# Patient Record
Sex: Female | Born: 1945 | Race: White | Hispanic: No | Marital: Single | Smoking: Never smoker
Health system: Southern US, Community
[De-identification: ages and names within clinical notes are randomized; demographics above are authoritative.]

## PROBLEM LIST (undated history)

## (undated) DIAGNOSIS — M419 Scoliosis, unspecified: Secondary | ICD-10-CM

## (undated) DIAGNOSIS — N189 Chronic kidney disease, unspecified: Secondary | ICD-10-CM

## (undated) DIAGNOSIS — Z9889 Other specified postprocedural states: Secondary | ICD-10-CM

## (undated) DIAGNOSIS — R251 Tremor, unspecified: Secondary | ICD-10-CM

## (undated) DIAGNOSIS — L719 Rosacea, unspecified: Secondary | ICD-10-CM

## (undated) DIAGNOSIS — G709 Myoneural disorder, unspecified: Secondary | ICD-10-CM

## (undated) DIAGNOSIS — M810 Age-related osteoporosis without current pathological fracture: Secondary | ICD-10-CM

## (undated) DIAGNOSIS — N1330 Unspecified hydronephrosis: Secondary | ICD-10-CM

## (undated) DIAGNOSIS — N1831 Chronic kidney disease, stage 3a: Secondary | ICD-10-CM

## (undated) DIAGNOSIS — R112 Nausea with vomiting, unspecified: Secondary | ICD-10-CM

## (undated) DIAGNOSIS — G243 Spasmodic torticollis: Secondary | ICD-10-CM

## (undated) HISTORY — DX: Spasmodic torticollis: G24.3

## (undated) HISTORY — DX: Age-related osteoporosis without current pathological fracture: M81.0

## (undated) HISTORY — PX: INGUINAL HERNIA REPAIR: SUR1180

## (undated) HISTORY — DX: Chronic kidney disease, stage 3a: N18.31

## (undated) HISTORY — DX: Unspecified hydronephrosis: N13.30

## (undated) HISTORY — DX: Scoliosis, unspecified: M41.9

---

## 1981-12-15 HISTORY — PX: BREAST LUMPECTOMY: SHX2

## 1999-12-16 HISTORY — PX: THYROID CYST EXCISION: SHX2511

## 2011-12-24 DIAGNOSIS — M531 Cervicobrachial syndrome: Secondary | ICD-10-CM | POA: Diagnosis not present

## 2011-12-24 DIAGNOSIS — M999 Biomechanical lesion, unspecified: Secondary | ICD-10-CM | POA: Diagnosis not present

## 2011-12-24 DIAGNOSIS — M5030 Other cervical disc degeneration, unspecified cervical region: Secondary | ICD-10-CM | POA: Diagnosis not present

## 2011-12-24 DIAGNOSIS — M9981 Other biomechanical lesions of cervical region: Secondary | ICD-10-CM | POA: Diagnosis not present

## 2012-01-22 DIAGNOSIS — M531 Cervicobrachial syndrome: Secondary | ICD-10-CM | POA: Diagnosis not present

## 2012-01-22 DIAGNOSIS — M9981 Other biomechanical lesions of cervical region: Secondary | ICD-10-CM | POA: Diagnosis not present

## 2012-01-22 DIAGNOSIS — M999 Biomechanical lesion, unspecified: Secondary | ICD-10-CM | POA: Diagnosis not present

## 2012-01-22 DIAGNOSIS — M5030 Other cervical disc degeneration, unspecified cervical region: Secondary | ICD-10-CM | POA: Diagnosis not present

## 2012-02-26 DIAGNOSIS — M999 Biomechanical lesion, unspecified: Secondary | ICD-10-CM | POA: Diagnosis not present

## 2012-02-26 DIAGNOSIS — M531 Cervicobrachial syndrome: Secondary | ICD-10-CM | POA: Diagnosis not present

## 2012-02-26 DIAGNOSIS — M9981 Other biomechanical lesions of cervical region: Secondary | ICD-10-CM | POA: Diagnosis not present

## 2012-02-26 DIAGNOSIS — M5030 Other cervical disc degeneration, unspecified cervical region: Secondary | ICD-10-CM | POA: Diagnosis not present

## 2012-04-08 DIAGNOSIS — M999 Biomechanical lesion, unspecified: Secondary | ICD-10-CM | POA: Diagnosis not present

## 2012-04-08 DIAGNOSIS — M531 Cervicobrachial syndrome: Secondary | ICD-10-CM | POA: Diagnosis not present

## 2012-04-08 DIAGNOSIS — M9981 Other biomechanical lesions of cervical region: Secondary | ICD-10-CM | POA: Diagnosis not present

## 2012-04-08 DIAGNOSIS — M5030 Other cervical disc degeneration, unspecified cervical region: Secondary | ICD-10-CM | POA: Diagnosis not present

## 2012-05-06 DIAGNOSIS — M999 Biomechanical lesion, unspecified: Secondary | ICD-10-CM | POA: Diagnosis not present

## 2012-05-06 DIAGNOSIS — M531 Cervicobrachial syndrome: Secondary | ICD-10-CM | POA: Diagnosis not present

## 2012-05-06 DIAGNOSIS — M9981 Other biomechanical lesions of cervical region: Secondary | ICD-10-CM | POA: Diagnosis not present

## 2012-05-06 DIAGNOSIS — M5030 Other cervical disc degeneration, unspecified cervical region: Secondary | ICD-10-CM | POA: Diagnosis not present

## 2012-06-03 DIAGNOSIS — M999 Biomechanical lesion, unspecified: Secondary | ICD-10-CM | POA: Diagnosis not present

## 2012-06-03 DIAGNOSIS — M9981 Other biomechanical lesions of cervical region: Secondary | ICD-10-CM | POA: Diagnosis not present

## 2012-06-03 DIAGNOSIS — M531 Cervicobrachial syndrome: Secondary | ICD-10-CM | POA: Diagnosis not present

## 2012-06-03 DIAGNOSIS — M5030 Other cervical disc degeneration, unspecified cervical region: Secondary | ICD-10-CM | POA: Diagnosis not present

## 2012-07-01 DIAGNOSIS — M999 Biomechanical lesion, unspecified: Secondary | ICD-10-CM | POA: Diagnosis not present

## 2012-07-01 DIAGNOSIS — M531 Cervicobrachial syndrome: Secondary | ICD-10-CM | POA: Diagnosis not present

## 2012-07-01 DIAGNOSIS — M9981 Other biomechanical lesions of cervical region: Secondary | ICD-10-CM | POA: Diagnosis not present

## 2012-07-01 DIAGNOSIS — M5030 Other cervical disc degeneration, unspecified cervical region: Secondary | ICD-10-CM | POA: Diagnosis not present

## 2012-08-05 DIAGNOSIS — M531 Cervicobrachial syndrome: Secondary | ICD-10-CM | POA: Diagnosis not present

## 2012-08-05 DIAGNOSIS — M5030 Other cervical disc degeneration, unspecified cervical region: Secondary | ICD-10-CM | POA: Diagnosis not present

## 2012-08-05 DIAGNOSIS — M999 Biomechanical lesion, unspecified: Secondary | ICD-10-CM | POA: Diagnosis not present

## 2012-08-05 DIAGNOSIS — M9981 Other biomechanical lesions of cervical region: Secondary | ICD-10-CM | POA: Diagnosis not present

## 2012-09-09 DIAGNOSIS — M9981 Other biomechanical lesions of cervical region: Secondary | ICD-10-CM | POA: Diagnosis not present

## 2012-09-09 DIAGNOSIS — M5126 Other intervertebral disc displacement, lumbar region: Secondary | ICD-10-CM | POA: Diagnosis not present

## 2012-09-09 DIAGNOSIS — M999 Biomechanical lesion, unspecified: Secondary | ICD-10-CM | POA: Diagnosis not present

## 2012-10-07 DIAGNOSIS — M999 Biomechanical lesion, unspecified: Secondary | ICD-10-CM | POA: Diagnosis not present

## 2012-10-07 DIAGNOSIS — M9981 Other biomechanical lesions of cervical region: Secondary | ICD-10-CM | POA: Diagnosis not present

## 2012-10-07 DIAGNOSIS — M5126 Other intervertebral disc displacement, lumbar region: Secondary | ICD-10-CM | POA: Diagnosis not present

## 2012-10-26 DIAGNOSIS — M5126 Other intervertebral disc displacement, lumbar region: Secondary | ICD-10-CM | POA: Diagnosis not present

## 2012-10-26 DIAGNOSIS — M9981 Other biomechanical lesions of cervical region: Secondary | ICD-10-CM | POA: Diagnosis not present

## 2012-10-26 DIAGNOSIS — M999 Biomechanical lesion, unspecified: Secondary | ICD-10-CM | POA: Diagnosis not present

## 2012-11-08 DIAGNOSIS — Q828 Other specified congenital malformations of skin: Secondary | ICD-10-CM | POA: Diagnosis not present

## 2012-12-06 DIAGNOSIS — M5126 Other intervertebral disc displacement, lumbar region: Secondary | ICD-10-CM | POA: Diagnosis not present

## 2012-12-06 DIAGNOSIS — M9981 Other biomechanical lesions of cervical region: Secondary | ICD-10-CM | POA: Diagnosis not present

## 2012-12-06 DIAGNOSIS — M999 Biomechanical lesion, unspecified: Secondary | ICD-10-CM | POA: Diagnosis not present

## 2012-12-28 DIAGNOSIS — M999 Biomechanical lesion, unspecified: Secondary | ICD-10-CM | POA: Diagnosis not present

## 2012-12-28 DIAGNOSIS — M9981 Other biomechanical lesions of cervical region: Secondary | ICD-10-CM | POA: Diagnosis not present

## 2012-12-28 DIAGNOSIS — M5126 Other intervertebral disc displacement, lumbar region: Secondary | ICD-10-CM | POA: Diagnosis not present

## 2013-02-11 DIAGNOSIS — M999 Biomechanical lesion, unspecified: Secondary | ICD-10-CM | POA: Diagnosis not present

## 2013-03-09 DIAGNOSIS — M9981 Other biomechanical lesions of cervical region: Secondary | ICD-10-CM | POA: Diagnosis not present

## 2013-03-09 DIAGNOSIS — M999 Biomechanical lesion, unspecified: Secondary | ICD-10-CM | POA: Diagnosis not present

## 2013-03-17 DIAGNOSIS — D1739 Benign lipomatous neoplasm of skin and subcutaneous tissue of other sites: Secondary | ICD-10-CM | POA: Diagnosis not present

## 2013-03-17 DIAGNOSIS — L820 Inflamed seborrheic keratosis: Secondary | ICD-10-CM | POA: Diagnosis not present

## 2013-03-17 DIAGNOSIS — L578 Other skin changes due to chronic exposure to nonionizing radiation: Secondary | ICD-10-CM | POA: Diagnosis not present

## 2013-04-11 DIAGNOSIS — M999 Biomechanical lesion, unspecified: Secondary | ICD-10-CM | POA: Diagnosis not present

## 2013-04-11 DIAGNOSIS — M9981 Other biomechanical lesions of cervical region: Secondary | ICD-10-CM | POA: Diagnosis not present

## 2013-05-03 DIAGNOSIS — D485 Neoplasm of uncertain behavior of skin: Secondary | ICD-10-CM | POA: Diagnosis not present

## 2013-05-30 DIAGNOSIS — H9319 Tinnitus, unspecified ear: Secondary | ICD-10-CM | POA: Diagnosis not present

## 2013-05-30 DIAGNOSIS — M9981 Other biomechanical lesions of cervical region: Secondary | ICD-10-CM | POA: Diagnosis not present

## 2013-05-30 DIAGNOSIS — M5030 Other cervical disc degeneration, unspecified cervical region: Secondary | ICD-10-CM | POA: Diagnosis not present

## 2013-05-30 DIAGNOSIS — H6120 Impacted cerumen, unspecified ear: Secondary | ICD-10-CM | POA: Diagnosis not present

## 2013-05-30 DIAGNOSIS — M531 Cervicobrachial syndrome: Secondary | ICD-10-CM | POA: Diagnosis not present

## 2013-05-30 DIAGNOSIS — M999 Biomechanical lesion, unspecified: Secondary | ICD-10-CM | POA: Diagnosis not present

## 2013-07-20 DIAGNOSIS — M5030 Other cervical disc degeneration, unspecified cervical region: Secondary | ICD-10-CM | POA: Diagnosis not present

## 2013-07-20 DIAGNOSIS — M999 Biomechanical lesion, unspecified: Secondary | ICD-10-CM | POA: Diagnosis not present

## 2013-07-20 DIAGNOSIS — M531 Cervicobrachial syndrome: Secondary | ICD-10-CM | POA: Diagnosis not present

## 2013-07-20 DIAGNOSIS — M9981 Other biomechanical lesions of cervical region: Secondary | ICD-10-CM | POA: Diagnosis not present

## 2013-10-04 DIAGNOSIS — M999 Biomechanical lesion, unspecified: Secondary | ICD-10-CM | POA: Diagnosis not present

## 2013-12-05 DIAGNOSIS — M999 Biomechanical lesion, unspecified: Secondary | ICD-10-CM | POA: Diagnosis not present

## 2013-12-23 DIAGNOSIS — M542 Cervicalgia: Secondary | ICD-10-CM | POA: Diagnosis not present

## 2014-01-16 DIAGNOSIS — M999 Biomechanical lesion, unspecified: Secondary | ICD-10-CM | POA: Diagnosis not present

## 2014-02-15 DIAGNOSIS — M999 Biomechanical lesion, unspecified: Secondary | ICD-10-CM | POA: Diagnosis not present

## 2014-03-08 DIAGNOSIS — M546 Pain in thoracic spine: Secondary | ICD-10-CM | POA: Diagnosis not present

## 2014-03-08 DIAGNOSIS — M545 Low back pain, unspecified: Secondary | ICD-10-CM | POA: Diagnosis not present

## 2014-03-08 DIAGNOSIS — M542 Cervicalgia: Secondary | ICD-10-CM | POA: Diagnosis not present

## 2014-03-08 DIAGNOSIS — M5412 Radiculopathy, cervical region: Secondary | ICD-10-CM | POA: Diagnosis not present

## 2014-03-09 DIAGNOSIS — M545 Low back pain, unspecified: Secondary | ICD-10-CM | POA: Diagnosis not present

## 2014-03-09 DIAGNOSIS — M5412 Radiculopathy, cervical region: Secondary | ICD-10-CM | POA: Diagnosis not present

## 2014-03-09 DIAGNOSIS — M542 Cervicalgia: Secondary | ICD-10-CM | POA: Diagnosis not present

## 2014-03-09 DIAGNOSIS — M5137 Other intervertebral disc degeneration, lumbosacral region: Secondary | ICD-10-CM | POA: Diagnosis not present

## 2014-03-09 DIAGNOSIS — M546 Pain in thoracic spine: Secondary | ICD-10-CM | POA: Diagnosis not present

## 2014-03-09 DIAGNOSIS — M9981 Other biomechanical lesions of cervical region: Secondary | ICD-10-CM | POA: Diagnosis not present

## 2014-03-09 DIAGNOSIS — M999 Biomechanical lesion, unspecified: Secondary | ICD-10-CM | POA: Diagnosis not present

## 2014-04-03 DIAGNOSIS — M545 Low back pain, unspecified: Secondary | ICD-10-CM | POA: Diagnosis not present

## 2014-04-03 DIAGNOSIS — M9981 Other biomechanical lesions of cervical region: Secondary | ICD-10-CM | POA: Diagnosis not present

## 2014-04-03 DIAGNOSIS — M5137 Other intervertebral disc degeneration, lumbosacral region: Secondary | ICD-10-CM | POA: Diagnosis not present

## 2014-04-03 DIAGNOSIS — M5412 Radiculopathy, cervical region: Secondary | ICD-10-CM | POA: Diagnosis not present

## 2014-04-03 DIAGNOSIS — M999 Biomechanical lesion, unspecified: Secondary | ICD-10-CM | POA: Diagnosis not present

## 2014-04-03 DIAGNOSIS — M546 Pain in thoracic spine: Secondary | ICD-10-CM | POA: Diagnosis not present

## 2014-04-03 DIAGNOSIS — M542 Cervicalgia: Secondary | ICD-10-CM | POA: Diagnosis not present

## 2014-05-05 DIAGNOSIS — M999 Biomechanical lesion, unspecified: Secondary | ICD-10-CM | POA: Diagnosis not present

## 2014-05-31 DIAGNOSIS — M999 Biomechanical lesion, unspecified: Secondary | ICD-10-CM | POA: Diagnosis not present

## 2014-06-22 DIAGNOSIS — M999 Biomechanical lesion, unspecified: Secondary | ICD-10-CM | POA: Diagnosis not present

## 2014-06-22 DIAGNOSIS — M5137 Other intervertebral disc degeneration, lumbosacral region: Secondary | ICD-10-CM | POA: Diagnosis not present

## 2014-06-22 DIAGNOSIS — M9981 Other biomechanical lesions of cervical region: Secondary | ICD-10-CM | POA: Diagnosis not present

## 2014-08-02 DIAGNOSIS — M999 Biomechanical lesion, unspecified: Secondary | ICD-10-CM | POA: Diagnosis not present

## 2014-08-30 DIAGNOSIS — M999 Biomechanical lesion, unspecified: Secondary | ICD-10-CM | POA: Diagnosis not present

## 2014-10-03 DIAGNOSIS — M9902 Segmental and somatic dysfunction of thoracic region: Secondary | ICD-10-CM | POA: Diagnosis not present

## 2014-10-03 DIAGNOSIS — M5441 Lumbago with sciatica, right side: Secondary | ICD-10-CM | POA: Diagnosis not present

## 2014-10-03 DIAGNOSIS — M546 Pain in thoracic spine: Secondary | ICD-10-CM | POA: Diagnosis not present

## 2014-10-03 DIAGNOSIS — M5136 Other intervertebral disc degeneration, lumbar region: Secondary | ICD-10-CM | POA: Diagnosis not present

## 2014-10-03 DIAGNOSIS — M9903 Segmental and somatic dysfunction of lumbar region: Secondary | ICD-10-CM | POA: Diagnosis not present

## 2014-10-03 DIAGNOSIS — M5137 Other intervertebral disc degeneration, lumbosacral region: Secondary | ICD-10-CM | POA: Diagnosis not present

## 2014-10-03 DIAGNOSIS — M9904 Segmental and somatic dysfunction of sacral region: Secondary | ICD-10-CM | POA: Diagnosis not present

## 2014-11-28 DIAGNOSIS — M5441 Lumbago with sciatica, right side: Secondary | ICD-10-CM | POA: Diagnosis not present

## 2014-11-28 DIAGNOSIS — M546 Pain in thoracic spine: Secondary | ICD-10-CM | POA: Diagnosis not present

## 2014-11-28 DIAGNOSIS — M9903 Segmental and somatic dysfunction of lumbar region: Secondary | ICD-10-CM | POA: Diagnosis not present

## 2014-11-28 DIAGNOSIS — M9904 Segmental and somatic dysfunction of sacral region: Secondary | ICD-10-CM | POA: Diagnosis not present

## 2014-11-28 DIAGNOSIS — M5137 Other intervertebral disc degeneration, lumbosacral region: Secondary | ICD-10-CM | POA: Diagnosis not present

## 2014-11-28 DIAGNOSIS — M9902 Segmental and somatic dysfunction of thoracic region: Secondary | ICD-10-CM | POA: Diagnosis not present

## 2014-11-28 DIAGNOSIS — M5136 Other intervertebral disc degeneration, lumbar region: Secondary | ICD-10-CM | POA: Diagnosis not present

## 2015-01-08 DIAGNOSIS — M5441 Lumbago with sciatica, right side: Secondary | ICD-10-CM | POA: Diagnosis not present

## 2015-01-08 DIAGNOSIS — M9902 Segmental and somatic dysfunction of thoracic region: Secondary | ICD-10-CM | POA: Diagnosis not present

## 2015-01-08 DIAGNOSIS — M9903 Segmental and somatic dysfunction of lumbar region: Secondary | ICD-10-CM | POA: Diagnosis not present

## 2015-01-08 DIAGNOSIS — M546 Pain in thoracic spine: Secondary | ICD-10-CM | POA: Diagnosis not present

## 2015-01-08 DIAGNOSIS — M9904 Segmental and somatic dysfunction of sacral region: Secondary | ICD-10-CM | POA: Diagnosis not present

## 2015-01-08 DIAGNOSIS — M5137 Other intervertebral disc degeneration, lumbosacral region: Secondary | ICD-10-CM | POA: Diagnosis not present

## 2015-01-08 DIAGNOSIS — M5136 Other intervertebral disc degeneration, lumbar region: Secondary | ICD-10-CM | POA: Diagnosis not present

## 2015-02-08 DIAGNOSIS — M9903 Segmental and somatic dysfunction of lumbar region: Secondary | ICD-10-CM | POA: Diagnosis not present

## 2015-02-08 DIAGNOSIS — M5441 Lumbago with sciatica, right side: Secondary | ICD-10-CM | POA: Diagnosis not present

## 2015-02-08 DIAGNOSIS — M5137 Other intervertebral disc degeneration, lumbosacral region: Secondary | ICD-10-CM | POA: Diagnosis not present

## 2015-02-08 DIAGNOSIS — M546 Pain in thoracic spine: Secondary | ICD-10-CM | POA: Diagnosis not present

## 2015-02-08 DIAGNOSIS — M5136 Other intervertebral disc degeneration, lumbar region: Secondary | ICD-10-CM | POA: Diagnosis not present

## 2015-02-08 DIAGNOSIS — M9904 Segmental and somatic dysfunction of sacral region: Secondary | ICD-10-CM | POA: Diagnosis not present

## 2015-02-08 DIAGNOSIS — M9902 Segmental and somatic dysfunction of thoracic region: Secondary | ICD-10-CM | POA: Diagnosis not present

## 2015-03-07 DIAGNOSIS — M5137 Other intervertebral disc degeneration, lumbosacral region: Secondary | ICD-10-CM | POA: Diagnosis not present

## 2015-03-07 DIAGNOSIS — M5136 Other intervertebral disc degeneration, lumbar region: Secondary | ICD-10-CM | POA: Diagnosis not present

## 2015-03-07 DIAGNOSIS — M9903 Segmental and somatic dysfunction of lumbar region: Secondary | ICD-10-CM | POA: Diagnosis not present

## 2015-03-07 DIAGNOSIS — M9902 Segmental and somatic dysfunction of thoracic region: Secondary | ICD-10-CM | POA: Diagnosis not present

## 2015-03-07 DIAGNOSIS — M546 Pain in thoracic spine: Secondary | ICD-10-CM | POA: Diagnosis not present

## 2015-03-07 DIAGNOSIS — M9904 Segmental and somatic dysfunction of sacral region: Secondary | ICD-10-CM | POA: Diagnosis not present

## 2015-03-07 DIAGNOSIS — M5441 Lumbago with sciatica, right side: Secondary | ICD-10-CM | POA: Diagnosis not present

## 2015-04-11 DIAGNOSIS — M5137 Other intervertebral disc degeneration, lumbosacral region: Secondary | ICD-10-CM | POA: Diagnosis not present

## 2015-04-11 DIAGNOSIS — M546 Pain in thoracic spine: Secondary | ICD-10-CM | POA: Diagnosis not present

## 2015-04-11 DIAGNOSIS — M9903 Segmental and somatic dysfunction of lumbar region: Secondary | ICD-10-CM | POA: Diagnosis not present

## 2015-04-11 DIAGNOSIS — M5136 Other intervertebral disc degeneration, lumbar region: Secondary | ICD-10-CM | POA: Diagnosis not present

## 2015-04-11 DIAGNOSIS — M5441 Lumbago with sciatica, right side: Secondary | ICD-10-CM | POA: Diagnosis not present

## 2015-04-11 DIAGNOSIS — M9904 Segmental and somatic dysfunction of sacral region: Secondary | ICD-10-CM | POA: Diagnosis not present

## 2015-04-11 DIAGNOSIS — M9902 Segmental and somatic dysfunction of thoracic region: Secondary | ICD-10-CM | POA: Diagnosis not present

## 2015-05-10 DIAGNOSIS — M9904 Segmental and somatic dysfunction of sacral region: Secondary | ICD-10-CM | POA: Diagnosis not present

## 2015-05-10 DIAGNOSIS — M546 Pain in thoracic spine: Secondary | ICD-10-CM | POA: Diagnosis not present

## 2015-05-10 DIAGNOSIS — M5136 Other intervertebral disc degeneration, lumbar region: Secondary | ICD-10-CM | POA: Diagnosis not present

## 2015-05-10 DIAGNOSIS — M5441 Lumbago with sciatica, right side: Secondary | ICD-10-CM | POA: Diagnosis not present

## 2015-05-10 DIAGNOSIS — M9903 Segmental and somatic dysfunction of lumbar region: Secondary | ICD-10-CM | POA: Diagnosis not present

## 2015-05-10 DIAGNOSIS — M5137 Other intervertebral disc degeneration, lumbosacral region: Secondary | ICD-10-CM | POA: Diagnosis not present

## 2015-05-10 DIAGNOSIS — M9902 Segmental and somatic dysfunction of thoracic region: Secondary | ICD-10-CM | POA: Diagnosis not present

## 2015-05-30 DIAGNOSIS — M9905 Segmental and somatic dysfunction of pelvic region: Secondary | ICD-10-CM | POA: Diagnosis not present

## 2015-05-30 DIAGNOSIS — M9904 Segmental and somatic dysfunction of sacral region: Secondary | ICD-10-CM | POA: Diagnosis not present

## 2015-05-30 DIAGNOSIS — M9903 Segmental and somatic dysfunction of lumbar region: Secondary | ICD-10-CM | POA: Diagnosis not present

## 2015-05-30 DIAGNOSIS — M9902 Segmental and somatic dysfunction of thoracic region: Secondary | ICD-10-CM | POA: Diagnosis not present

## 2015-08-01 DIAGNOSIS — M9903 Segmental and somatic dysfunction of lumbar region: Secondary | ICD-10-CM | POA: Diagnosis not present

## 2015-08-01 DIAGNOSIS — M9905 Segmental and somatic dysfunction of pelvic region: Secondary | ICD-10-CM | POA: Diagnosis not present

## 2015-08-01 DIAGNOSIS — M9902 Segmental and somatic dysfunction of thoracic region: Secondary | ICD-10-CM | POA: Diagnosis not present

## 2015-08-01 DIAGNOSIS — M9904 Segmental and somatic dysfunction of sacral region: Secondary | ICD-10-CM | POA: Diagnosis not present

## 2015-09-25 DIAGNOSIS — M9903 Segmental and somatic dysfunction of lumbar region: Secondary | ICD-10-CM | POA: Diagnosis not present

## 2015-09-25 DIAGNOSIS — M5137 Other intervertebral disc degeneration, lumbosacral region: Secondary | ICD-10-CM | POA: Diagnosis not present

## 2015-09-25 DIAGNOSIS — M5441 Lumbago with sciatica, right side: Secondary | ICD-10-CM | POA: Diagnosis not present

## 2015-09-25 DIAGNOSIS — M5136 Other intervertebral disc degeneration, lumbar region: Secondary | ICD-10-CM | POA: Diagnosis not present

## 2015-09-25 DIAGNOSIS — M9902 Segmental and somatic dysfunction of thoracic region: Secondary | ICD-10-CM | POA: Diagnosis not present

## 2015-09-25 DIAGNOSIS — M9904 Segmental and somatic dysfunction of sacral region: Secondary | ICD-10-CM | POA: Diagnosis not present

## 2015-09-25 DIAGNOSIS — M546 Pain in thoracic spine: Secondary | ICD-10-CM | POA: Diagnosis not present

## 2015-10-16 DIAGNOSIS — M9903 Segmental and somatic dysfunction of lumbar region: Secondary | ICD-10-CM | POA: Diagnosis not present

## 2015-10-16 DIAGNOSIS — M9902 Segmental and somatic dysfunction of thoracic region: Secondary | ICD-10-CM | POA: Diagnosis not present

## 2015-10-16 DIAGNOSIS — M9905 Segmental and somatic dysfunction of pelvic region: Secondary | ICD-10-CM | POA: Diagnosis not present

## 2015-10-16 DIAGNOSIS — M9904 Segmental and somatic dysfunction of sacral region: Secondary | ICD-10-CM | POA: Diagnosis not present

## 2015-12-05 DIAGNOSIS — M9902 Segmental and somatic dysfunction of thoracic region: Secondary | ICD-10-CM | POA: Diagnosis not present

## 2015-12-05 DIAGNOSIS — M9905 Segmental and somatic dysfunction of pelvic region: Secondary | ICD-10-CM | POA: Diagnosis not present

## 2015-12-05 DIAGNOSIS — M9903 Segmental and somatic dysfunction of lumbar region: Secondary | ICD-10-CM | POA: Diagnosis not present

## 2015-12-05 DIAGNOSIS — M9904 Segmental and somatic dysfunction of sacral region: Secondary | ICD-10-CM | POA: Diagnosis not present

## 2016-01-02 DIAGNOSIS — M5441 Lumbago with sciatica, right side: Secondary | ICD-10-CM | POA: Diagnosis not present

## 2016-01-02 DIAGNOSIS — M9904 Segmental and somatic dysfunction of sacral region: Secondary | ICD-10-CM | POA: Diagnosis not present

## 2016-01-02 DIAGNOSIS — M5137 Other intervertebral disc degeneration, lumbosacral region: Secondary | ICD-10-CM | POA: Diagnosis not present

## 2016-01-02 DIAGNOSIS — M9903 Segmental and somatic dysfunction of lumbar region: Secondary | ICD-10-CM | POA: Diagnosis not present

## 2016-01-02 DIAGNOSIS — M546 Pain in thoracic spine: Secondary | ICD-10-CM | POA: Diagnosis not present

## 2016-01-02 DIAGNOSIS — M5136 Other intervertebral disc degeneration, lumbar region: Secondary | ICD-10-CM | POA: Diagnosis not present

## 2016-01-02 DIAGNOSIS — M9902 Segmental and somatic dysfunction of thoracic region: Secondary | ICD-10-CM | POA: Diagnosis not present

## 2016-02-11 DIAGNOSIS — M546 Pain in thoracic spine: Secondary | ICD-10-CM | POA: Diagnosis not present

## 2016-02-11 DIAGNOSIS — M5441 Lumbago with sciatica, right side: Secondary | ICD-10-CM | POA: Diagnosis not present

## 2016-02-11 DIAGNOSIS — M9902 Segmental and somatic dysfunction of thoracic region: Secondary | ICD-10-CM | POA: Diagnosis not present

## 2016-02-11 DIAGNOSIS — M9904 Segmental and somatic dysfunction of sacral region: Secondary | ICD-10-CM | POA: Diagnosis not present

## 2016-02-11 DIAGNOSIS — M5137 Other intervertebral disc degeneration, lumbosacral region: Secondary | ICD-10-CM | POA: Diagnosis not present

## 2016-02-11 DIAGNOSIS — M9903 Segmental and somatic dysfunction of lumbar region: Secondary | ICD-10-CM | POA: Diagnosis not present

## 2016-02-11 DIAGNOSIS — M5136 Other intervertebral disc degeneration, lumbar region: Secondary | ICD-10-CM | POA: Diagnosis not present

## 2016-03-05 DIAGNOSIS — M5137 Other intervertebral disc degeneration, lumbosacral region: Secondary | ICD-10-CM | POA: Diagnosis not present

## 2016-03-05 DIAGNOSIS — M9902 Segmental and somatic dysfunction of thoracic region: Secondary | ICD-10-CM | POA: Diagnosis not present

## 2016-03-05 DIAGNOSIS — M546 Pain in thoracic spine: Secondary | ICD-10-CM | POA: Diagnosis not present

## 2016-03-05 DIAGNOSIS — M5136 Other intervertebral disc degeneration, lumbar region: Secondary | ICD-10-CM | POA: Diagnosis not present

## 2016-03-05 DIAGNOSIS — M5441 Lumbago with sciatica, right side: Secondary | ICD-10-CM | POA: Diagnosis not present

## 2016-03-05 DIAGNOSIS — M9904 Segmental and somatic dysfunction of sacral region: Secondary | ICD-10-CM | POA: Diagnosis not present

## 2016-03-05 DIAGNOSIS — M9903 Segmental and somatic dysfunction of lumbar region: Secondary | ICD-10-CM | POA: Diagnosis not present

## 2016-04-21 DIAGNOSIS — M419 Scoliosis, unspecified: Secondary | ICD-10-CM | POA: Diagnosis not present

## 2016-04-28 DIAGNOSIS — M9902 Segmental and somatic dysfunction of thoracic region: Secondary | ICD-10-CM | POA: Diagnosis not present

## 2016-04-28 DIAGNOSIS — M546 Pain in thoracic spine: Secondary | ICD-10-CM | POA: Diagnosis not present

## 2016-04-28 DIAGNOSIS — M5136 Other intervertebral disc degeneration, lumbar region: Secondary | ICD-10-CM | POA: Diagnosis not present

## 2016-04-28 DIAGNOSIS — M5137 Other intervertebral disc degeneration, lumbosacral region: Secondary | ICD-10-CM | POA: Diagnosis not present

## 2016-04-28 DIAGNOSIS — M9904 Segmental and somatic dysfunction of sacral region: Secondary | ICD-10-CM | POA: Diagnosis not present

## 2016-04-28 DIAGNOSIS — M5441 Lumbago with sciatica, right side: Secondary | ICD-10-CM | POA: Diagnosis not present

## 2016-04-28 DIAGNOSIS — M9903 Segmental and somatic dysfunction of lumbar region: Secondary | ICD-10-CM | POA: Diagnosis not present

## 2016-05-05 DIAGNOSIS — M810 Age-related osteoporosis without current pathological fracture: Secondary | ICD-10-CM | POA: Diagnosis not present

## 2016-05-30 DIAGNOSIS — M9902 Segmental and somatic dysfunction of thoracic region: Secondary | ICD-10-CM | POA: Diagnosis not present

## 2016-05-30 DIAGNOSIS — M9904 Segmental and somatic dysfunction of sacral region: Secondary | ICD-10-CM | POA: Diagnosis not present

## 2016-05-30 DIAGNOSIS — M9901 Segmental and somatic dysfunction of cervical region: Secondary | ICD-10-CM | POA: Diagnosis not present

## 2016-05-30 DIAGNOSIS — M9903 Segmental and somatic dysfunction of lumbar region: Secondary | ICD-10-CM | POA: Diagnosis not present

## 2016-06-09 DIAGNOSIS — M5137 Other intervertebral disc degeneration, lumbosacral region: Secondary | ICD-10-CM | POA: Diagnosis not present

## 2016-06-09 DIAGNOSIS — M9902 Segmental and somatic dysfunction of thoracic region: Secondary | ICD-10-CM | POA: Diagnosis not present

## 2016-06-09 DIAGNOSIS — M546 Pain in thoracic spine: Secondary | ICD-10-CM | POA: Diagnosis not present

## 2016-06-09 DIAGNOSIS — M9904 Segmental and somatic dysfunction of sacral region: Secondary | ICD-10-CM | POA: Diagnosis not present

## 2016-06-09 DIAGNOSIS — M9903 Segmental and somatic dysfunction of lumbar region: Secondary | ICD-10-CM | POA: Diagnosis not present

## 2016-06-09 DIAGNOSIS — M5441 Lumbago with sciatica, right side: Secondary | ICD-10-CM | POA: Diagnosis not present

## 2016-06-09 DIAGNOSIS — M5136 Other intervertebral disc degeneration, lumbar region: Secondary | ICD-10-CM | POA: Diagnosis not present

## 2016-07-14 DIAGNOSIS — M9904 Segmental and somatic dysfunction of sacral region: Secondary | ICD-10-CM | POA: Diagnosis not present

## 2016-07-14 DIAGNOSIS — M546 Pain in thoracic spine: Secondary | ICD-10-CM | POA: Diagnosis not present

## 2016-07-14 DIAGNOSIS — M5441 Lumbago with sciatica, right side: Secondary | ICD-10-CM | POA: Diagnosis not present

## 2016-07-14 DIAGNOSIS — M5137 Other intervertebral disc degeneration, lumbosacral region: Secondary | ICD-10-CM | POA: Diagnosis not present

## 2016-07-14 DIAGNOSIS — M9903 Segmental and somatic dysfunction of lumbar region: Secondary | ICD-10-CM | POA: Diagnosis not present

## 2016-07-14 DIAGNOSIS — M9902 Segmental and somatic dysfunction of thoracic region: Secondary | ICD-10-CM | POA: Diagnosis not present

## 2016-07-14 DIAGNOSIS — M5136 Other intervertebral disc degeneration, lumbar region: Secondary | ICD-10-CM | POA: Diagnosis not present

## 2016-07-30 DIAGNOSIS — Z0000 Encounter for general adult medical examination without abnormal findings: Secondary | ICD-10-CM | POA: Diagnosis not present

## 2016-07-30 DIAGNOSIS — E559 Vitamin D deficiency, unspecified: Secondary | ICD-10-CM | POA: Diagnosis not present

## 2016-07-30 DIAGNOSIS — R030 Elevated blood-pressure reading, without diagnosis of hypertension: Secondary | ICD-10-CM | POA: Diagnosis not present

## 2016-07-30 DIAGNOSIS — M810 Age-related osteoporosis without current pathological fracture: Secondary | ICD-10-CM | POA: Diagnosis not present

## 2016-07-30 DIAGNOSIS — M4850XS Collapsed vertebra, not elsewhere classified, site unspecified, sequela of fracture: Secondary | ICD-10-CM | POA: Diagnosis not present

## 2016-07-30 DIAGNOSIS — Z8639 Personal history of other endocrine, nutritional and metabolic disease: Secondary | ICD-10-CM | POA: Diagnosis not present

## 2016-07-30 DIAGNOSIS — Z1389 Encounter for screening for other disorder: Secondary | ICD-10-CM | POA: Diagnosis not present

## 2016-07-30 DIAGNOSIS — R251 Tremor, unspecified: Secondary | ICD-10-CM | POA: Diagnosis not present

## 2016-07-30 DIAGNOSIS — Z6826 Body mass index (BMI) 26.0-26.9, adult: Secondary | ICD-10-CM | POA: Diagnosis not present

## 2016-07-30 DIAGNOSIS — M419 Scoliosis, unspecified: Secondary | ICD-10-CM | POA: Diagnosis not present

## 2016-08-13 DIAGNOSIS — M5136 Other intervertebral disc degeneration, lumbar region: Secondary | ICD-10-CM | POA: Diagnosis not present

## 2016-08-13 DIAGNOSIS — M5137 Other intervertebral disc degeneration, lumbosacral region: Secondary | ICD-10-CM | POA: Diagnosis not present

## 2016-08-13 DIAGNOSIS — M9902 Segmental and somatic dysfunction of thoracic region: Secondary | ICD-10-CM | POA: Diagnosis not present

## 2016-08-13 DIAGNOSIS — M546 Pain in thoracic spine: Secondary | ICD-10-CM | POA: Diagnosis not present

## 2016-08-13 DIAGNOSIS — M5441 Lumbago with sciatica, right side: Secondary | ICD-10-CM | POA: Diagnosis not present

## 2016-08-13 DIAGNOSIS — M9903 Segmental and somatic dysfunction of lumbar region: Secondary | ICD-10-CM | POA: Diagnosis not present

## 2016-08-13 DIAGNOSIS — M9904 Segmental and somatic dysfunction of sacral region: Secondary | ICD-10-CM | POA: Diagnosis not present

## 2016-09-10 DIAGNOSIS — M9902 Segmental and somatic dysfunction of thoracic region: Secondary | ICD-10-CM | POA: Diagnosis not present

## 2016-09-10 DIAGNOSIS — M546 Pain in thoracic spine: Secondary | ICD-10-CM | POA: Diagnosis not present

## 2016-09-10 DIAGNOSIS — M5136 Other intervertebral disc degeneration, lumbar region: Secondary | ICD-10-CM | POA: Diagnosis not present

## 2016-09-10 DIAGNOSIS — M9904 Segmental and somatic dysfunction of sacral region: Secondary | ICD-10-CM | POA: Diagnosis not present

## 2016-09-10 DIAGNOSIS — M9903 Segmental and somatic dysfunction of lumbar region: Secondary | ICD-10-CM | POA: Diagnosis not present

## 2016-09-10 DIAGNOSIS — M5441 Lumbago with sciatica, right side: Secondary | ICD-10-CM | POA: Diagnosis not present

## 2016-09-10 DIAGNOSIS — M5137 Other intervertebral disc degeneration, lumbosacral region: Secondary | ICD-10-CM | POA: Diagnosis not present

## 2016-10-08 DIAGNOSIS — M5137 Other intervertebral disc degeneration, lumbosacral region: Secondary | ICD-10-CM | POA: Diagnosis not present

## 2016-10-08 DIAGNOSIS — M9902 Segmental and somatic dysfunction of thoracic region: Secondary | ICD-10-CM | POA: Diagnosis not present

## 2016-10-08 DIAGNOSIS — M5136 Other intervertebral disc degeneration, lumbar region: Secondary | ICD-10-CM | POA: Diagnosis not present

## 2016-10-08 DIAGNOSIS — M546 Pain in thoracic spine: Secondary | ICD-10-CM | POA: Diagnosis not present

## 2016-10-08 DIAGNOSIS — M5441 Lumbago with sciatica, right side: Secondary | ICD-10-CM | POA: Diagnosis not present

## 2016-10-08 DIAGNOSIS — M9904 Segmental and somatic dysfunction of sacral region: Secondary | ICD-10-CM | POA: Diagnosis not present

## 2016-10-08 DIAGNOSIS — M9903 Segmental and somatic dysfunction of lumbar region: Secondary | ICD-10-CM | POA: Diagnosis not present

## 2016-11-12 DIAGNOSIS — M5137 Other intervertebral disc degeneration, lumbosacral region: Secondary | ICD-10-CM | POA: Diagnosis not present

## 2016-11-12 DIAGNOSIS — M5441 Lumbago with sciatica, right side: Secondary | ICD-10-CM | POA: Diagnosis not present

## 2016-11-12 DIAGNOSIS — M9904 Segmental and somatic dysfunction of sacral region: Secondary | ICD-10-CM | POA: Diagnosis not present

## 2016-11-12 DIAGNOSIS — M546 Pain in thoracic spine: Secondary | ICD-10-CM | POA: Diagnosis not present

## 2016-11-12 DIAGNOSIS — M9902 Segmental and somatic dysfunction of thoracic region: Secondary | ICD-10-CM | POA: Diagnosis not present

## 2016-11-12 DIAGNOSIS — M9903 Segmental and somatic dysfunction of lumbar region: Secondary | ICD-10-CM | POA: Diagnosis not present

## 2016-11-12 DIAGNOSIS — M5136 Other intervertebral disc degeneration, lumbar region: Secondary | ICD-10-CM | POA: Diagnosis not present

## 2016-12-03 DIAGNOSIS — M5136 Other intervertebral disc degeneration, lumbar region: Secondary | ICD-10-CM | POA: Diagnosis not present

## 2016-12-03 DIAGNOSIS — M9902 Segmental and somatic dysfunction of thoracic region: Secondary | ICD-10-CM | POA: Diagnosis not present

## 2016-12-03 DIAGNOSIS — M9904 Segmental and somatic dysfunction of sacral region: Secondary | ICD-10-CM | POA: Diagnosis not present

## 2016-12-03 DIAGNOSIS — M5137 Other intervertebral disc degeneration, lumbosacral region: Secondary | ICD-10-CM | POA: Diagnosis not present

## 2016-12-03 DIAGNOSIS — M9903 Segmental and somatic dysfunction of lumbar region: Secondary | ICD-10-CM | POA: Diagnosis not present

## 2016-12-03 DIAGNOSIS — M546 Pain in thoracic spine: Secondary | ICD-10-CM | POA: Diagnosis not present

## 2016-12-03 DIAGNOSIS — M5441 Lumbago with sciatica, right side: Secondary | ICD-10-CM | POA: Diagnosis not present

## 2016-12-17 DIAGNOSIS — Z1231 Encounter for screening mammogram for malignant neoplasm of breast: Secondary | ICD-10-CM | POA: Diagnosis not present

## 2017-01-15 DIAGNOSIS — M5441 Lumbago with sciatica, right side: Secondary | ICD-10-CM | POA: Diagnosis not present

## 2017-01-15 DIAGNOSIS — M9902 Segmental and somatic dysfunction of thoracic region: Secondary | ICD-10-CM | POA: Diagnosis not present

## 2017-01-15 DIAGNOSIS — M5137 Other intervertebral disc degeneration, lumbosacral region: Secondary | ICD-10-CM | POA: Diagnosis not present

## 2017-01-15 DIAGNOSIS — M9903 Segmental and somatic dysfunction of lumbar region: Secondary | ICD-10-CM | POA: Diagnosis not present

## 2017-01-15 DIAGNOSIS — M9904 Segmental and somatic dysfunction of sacral region: Secondary | ICD-10-CM | POA: Diagnosis not present

## 2017-01-15 DIAGNOSIS — M5136 Other intervertebral disc degeneration, lumbar region: Secondary | ICD-10-CM | POA: Diagnosis not present

## 2017-01-15 DIAGNOSIS — M546 Pain in thoracic spine: Secondary | ICD-10-CM | POA: Diagnosis not present

## 2017-02-16 DIAGNOSIS — M5136 Other intervertebral disc degeneration, lumbar region: Secondary | ICD-10-CM | POA: Diagnosis not present

## 2017-02-16 DIAGNOSIS — M9903 Segmental and somatic dysfunction of lumbar region: Secondary | ICD-10-CM | POA: Diagnosis not present

## 2017-02-16 DIAGNOSIS — M5137 Other intervertebral disc degeneration, lumbosacral region: Secondary | ICD-10-CM | POA: Diagnosis not present

## 2017-02-16 DIAGNOSIS — M419 Scoliosis, unspecified: Secondary | ICD-10-CM | POA: Diagnosis not present

## 2017-02-16 DIAGNOSIS — M2141 Flat foot [pes planus] (acquired), right foot: Secondary | ICD-10-CM | POA: Diagnosis not present

## 2017-02-16 DIAGNOSIS — M2142 Flat foot [pes planus] (acquired), left foot: Secondary | ICD-10-CM | POA: Diagnosis not present

## 2017-02-16 DIAGNOSIS — M545 Low back pain: Secondary | ICD-10-CM | POA: Diagnosis not present

## 2017-02-16 DIAGNOSIS — M5441 Lumbago with sciatica, right side: Secondary | ICD-10-CM | POA: Diagnosis not present

## 2017-02-16 DIAGNOSIS — M9904 Segmental and somatic dysfunction of sacral region: Secondary | ICD-10-CM | POA: Diagnosis not present

## 2017-02-16 DIAGNOSIS — M9902 Segmental and somatic dysfunction of thoracic region: Secondary | ICD-10-CM | POA: Diagnosis not present

## 2017-02-16 DIAGNOSIS — M546 Pain in thoracic spine: Secondary | ICD-10-CM | POA: Diagnosis not present

## 2017-02-23 DIAGNOSIS — M2141 Flat foot [pes planus] (acquired), right foot: Secondary | ICD-10-CM | POA: Diagnosis not present

## 2017-02-23 DIAGNOSIS — M2142 Flat foot [pes planus] (acquired), left foot: Secondary | ICD-10-CM | POA: Diagnosis not present

## 2017-02-23 DIAGNOSIS — M545 Low back pain: Secondary | ICD-10-CM | POA: Diagnosis not present

## 2017-02-23 DIAGNOSIS — M419 Scoliosis, unspecified: Secondary | ICD-10-CM | POA: Diagnosis not present

## 2017-03-03 DIAGNOSIS — M2141 Flat foot [pes planus] (acquired), right foot: Secondary | ICD-10-CM | POA: Diagnosis not present

## 2017-03-03 DIAGNOSIS — M545 Low back pain: Secondary | ICD-10-CM | POA: Diagnosis not present

## 2017-03-03 DIAGNOSIS — M419 Scoliosis, unspecified: Secondary | ICD-10-CM | POA: Diagnosis not present

## 2017-03-03 DIAGNOSIS — M2142 Flat foot [pes planus] (acquired), left foot: Secondary | ICD-10-CM | POA: Diagnosis not present

## 2017-03-04 DIAGNOSIS — M9903 Segmental and somatic dysfunction of lumbar region: Secondary | ICD-10-CM | POA: Diagnosis not present

## 2017-03-04 DIAGNOSIS — M5441 Lumbago with sciatica, right side: Secondary | ICD-10-CM | POA: Diagnosis not present

## 2017-03-04 DIAGNOSIS — M546 Pain in thoracic spine: Secondary | ICD-10-CM | POA: Diagnosis not present

## 2017-03-04 DIAGNOSIS — M5137 Other intervertebral disc degeneration, lumbosacral region: Secondary | ICD-10-CM | POA: Diagnosis not present

## 2017-03-04 DIAGNOSIS — M9902 Segmental and somatic dysfunction of thoracic region: Secondary | ICD-10-CM | POA: Diagnosis not present

## 2017-03-04 DIAGNOSIS — M5136 Other intervertebral disc degeneration, lumbar region: Secondary | ICD-10-CM | POA: Diagnosis not present

## 2017-03-04 DIAGNOSIS — M9904 Segmental and somatic dysfunction of sacral region: Secondary | ICD-10-CM | POA: Diagnosis not present

## 2017-03-09 DIAGNOSIS — M419 Scoliosis, unspecified: Secondary | ICD-10-CM | POA: Diagnosis not present

## 2017-03-09 DIAGNOSIS — M545 Low back pain: Secondary | ICD-10-CM | POA: Diagnosis not present

## 2017-03-09 DIAGNOSIS — M2141 Flat foot [pes planus] (acquired), right foot: Secondary | ICD-10-CM | POA: Diagnosis not present

## 2017-03-09 DIAGNOSIS — M2142 Flat foot [pes planus] (acquired), left foot: Secondary | ICD-10-CM | POA: Diagnosis not present

## 2017-03-12 DIAGNOSIS — M5136 Other intervertebral disc degeneration, lumbar region: Secondary | ICD-10-CM | POA: Diagnosis not present

## 2017-03-12 DIAGNOSIS — M546 Pain in thoracic spine: Secondary | ICD-10-CM | POA: Diagnosis not present

## 2017-03-12 DIAGNOSIS — M9902 Segmental and somatic dysfunction of thoracic region: Secondary | ICD-10-CM | POA: Diagnosis not present

## 2017-03-12 DIAGNOSIS — M5137 Other intervertebral disc degeneration, lumbosacral region: Secondary | ICD-10-CM | POA: Diagnosis not present

## 2017-03-12 DIAGNOSIS — M9904 Segmental and somatic dysfunction of sacral region: Secondary | ICD-10-CM | POA: Diagnosis not present

## 2017-03-12 DIAGNOSIS — M9903 Segmental and somatic dysfunction of lumbar region: Secondary | ICD-10-CM | POA: Diagnosis not present

## 2017-03-12 DIAGNOSIS — M5441 Lumbago with sciatica, right side: Secondary | ICD-10-CM | POA: Diagnosis not present

## 2017-03-20 DIAGNOSIS — M545 Low back pain: Secondary | ICD-10-CM | POA: Diagnosis not present

## 2017-03-20 DIAGNOSIS — M2142 Flat foot [pes planus] (acquired), left foot: Secondary | ICD-10-CM | POA: Diagnosis not present

## 2017-03-20 DIAGNOSIS — M2141 Flat foot [pes planus] (acquired), right foot: Secondary | ICD-10-CM | POA: Diagnosis not present

## 2017-03-20 DIAGNOSIS — M419 Scoliosis, unspecified: Secondary | ICD-10-CM | POA: Diagnosis not present

## 2017-04-28 DIAGNOSIS — M9904 Segmental and somatic dysfunction of sacral region: Secondary | ICD-10-CM | POA: Diagnosis not present

## 2017-04-28 DIAGNOSIS — M5137 Other intervertebral disc degeneration, lumbosacral region: Secondary | ICD-10-CM | POA: Diagnosis not present

## 2017-04-28 DIAGNOSIS — M5136 Other intervertebral disc degeneration, lumbar region: Secondary | ICD-10-CM | POA: Diagnosis not present

## 2017-04-28 DIAGNOSIS — M546 Pain in thoracic spine: Secondary | ICD-10-CM | POA: Diagnosis not present

## 2017-04-28 DIAGNOSIS — M5441 Lumbago with sciatica, right side: Secondary | ICD-10-CM | POA: Diagnosis not present

## 2017-04-28 DIAGNOSIS — M9902 Segmental and somatic dysfunction of thoracic region: Secondary | ICD-10-CM | POA: Diagnosis not present

## 2017-04-28 DIAGNOSIS — M9903 Segmental and somatic dysfunction of lumbar region: Secondary | ICD-10-CM | POA: Diagnosis not present

## 2017-06-10 DIAGNOSIS — M9904 Segmental and somatic dysfunction of sacral region: Secondary | ICD-10-CM | POA: Diagnosis not present

## 2017-06-10 DIAGNOSIS — M9902 Segmental and somatic dysfunction of thoracic region: Secondary | ICD-10-CM | POA: Diagnosis not present

## 2017-06-10 DIAGNOSIS — M9903 Segmental and somatic dysfunction of lumbar region: Secondary | ICD-10-CM | POA: Diagnosis not present

## 2017-06-10 DIAGNOSIS — M5137 Other intervertebral disc degeneration, lumbosacral region: Secondary | ICD-10-CM | POA: Diagnosis not present

## 2017-06-10 DIAGNOSIS — M546 Pain in thoracic spine: Secondary | ICD-10-CM | POA: Diagnosis not present

## 2017-06-10 DIAGNOSIS — M5441 Lumbago with sciatica, right side: Secondary | ICD-10-CM | POA: Diagnosis not present

## 2017-06-10 DIAGNOSIS — M5136 Other intervertebral disc degeneration, lumbar region: Secondary | ICD-10-CM | POA: Diagnosis not present

## 2017-07-08 DIAGNOSIS — M9902 Segmental and somatic dysfunction of thoracic region: Secondary | ICD-10-CM | POA: Diagnosis not present

## 2017-07-08 DIAGNOSIS — M9903 Segmental and somatic dysfunction of lumbar region: Secondary | ICD-10-CM | POA: Diagnosis not present

## 2017-07-08 DIAGNOSIS — M5136 Other intervertebral disc degeneration, lumbar region: Secondary | ICD-10-CM | POA: Diagnosis not present

## 2017-07-08 DIAGNOSIS — M5137 Other intervertebral disc degeneration, lumbosacral region: Secondary | ICD-10-CM | POA: Diagnosis not present

## 2017-07-08 DIAGNOSIS — M546 Pain in thoracic spine: Secondary | ICD-10-CM | POA: Diagnosis not present

## 2017-07-08 DIAGNOSIS — M9904 Segmental and somatic dysfunction of sacral region: Secondary | ICD-10-CM | POA: Diagnosis not present

## 2017-07-08 DIAGNOSIS — M5441 Lumbago with sciatica, right side: Secondary | ICD-10-CM | POA: Diagnosis not present

## 2017-08-12 DIAGNOSIS — M9902 Segmental and somatic dysfunction of thoracic region: Secondary | ICD-10-CM | POA: Diagnosis not present

## 2017-08-12 DIAGNOSIS — M546 Pain in thoracic spine: Secondary | ICD-10-CM | POA: Diagnosis not present

## 2017-08-12 DIAGNOSIS — M5441 Lumbago with sciatica, right side: Secondary | ICD-10-CM | POA: Diagnosis not present

## 2017-08-12 DIAGNOSIS — M5136 Other intervertebral disc degeneration, lumbar region: Secondary | ICD-10-CM | POA: Diagnosis not present

## 2017-08-12 DIAGNOSIS — M5137 Other intervertebral disc degeneration, lumbosacral region: Secondary | ICD-10-CM | POA: Diagnosis not present

## 2017-08-12 DIAGNOSIS — M9903 Segmental and somatic dysfunction of lumbar region: Secondary | ICD-10-CM | POA: Diagnosis not present

## 2017-08-12 DIAGNOSIS — M9904 Segmental and somatic dysfunction of sacral region: Secondary | ICD-10-CM | POA: Diagnosis not present

## 2017-09-28 DIAGNOSIS — M546 Pain in thoracic spine: Secondary | ICD-10-CM | POA: Diagnosis not present

## 2017-09-28 DIAGNOSIS — M5136 Other intervertebral disc degeneration, lumbar region: Secondary | ICD-10-CM | POA: Diagnosis not present

## 2017-09-28 DIAGNOSIS — M9904 Segmental and somatic dysfunction of sacral region: Secondary | ICD-10-CM | POA: Diagnosis not present

## 2017-09-28 DIAGNOSIS — M5137 Other intervertebral disc degeneration, lumbosacral region: Secondary | ICD-10-CM | POA: Diagnosis not present

## 2017-09-28 DIAGNOSIS — M9902 Segmental and somatic dysfunction of thoracic region: Secondary | ICD-10-CM | POA: Diagnosis not present

## 2017-09-28 DIAGNOSIS — M5441 Lumbago with sciatica, right side: Secondary | ICD-10-CM | POA: Diagnosis not present

## 2017-09-28 DIAGNOSIS — M9903 Segmental and somatic dysfunction of lumbar region: Secondary | ICD-10-CM | POA: Diagnosis not present

## 2017-10-21 DIAGNOSIS — M5441 Lumbago with sciatica, right side: Secondary | ICD-10-CM | POA: Diagnosis not present

## 2017-10-21 DIAGNOSIS — M546 Pain in thoracic spine: Secondary | ICD-10-CM | POA: Diagnosis not present

## 2017-10-21 DIAGNOSIS — M9902 Segmental and somatic dysfunction of thoracic region: Secondary | ICD-10-CM | POA: Diagnosis not present

## 2017-10-21 DIAGNOSIS — M5137 Other intervertebral disc degeneration, lumbosacral region: Secondary | ICD-10-CM | POA: Diagnosis not present

## 2017-10-21 DIAGNOSIS — M9903 Segmental and somatic dysfunction of lumbar region: Secondary | ICD-10-CM | POA: Diagnosis not present

## 2017-10-21 DIAGNOSIS — M9904 Segmental and somatic dysfunction of sacral region: Secondary | ICD-10-CM | POA: Diagnosis not present

## 2017-10-21 DIAGNOSIS — M5136 Other intervertebral disc degeneration, lumbar region: Secondary | ICD-10-CM | POA: Diagnosis not present

## 2017-11-18 DIAGNOSIS — M9902 Segmental and somatic dysfunction of thoracic region: Secondary | ICD-10-CM | POA: Diagnosis not present

## 2017-11-18 DIAGNOSIS — M546 Pain in thoracic spine: Secondary | ICD-10-CM | POA: Diagnosis not present

## 2017-11-18 DIAGNOSIS — M5136 Other intervertebral disc degeneration, lumbar region: Secondary | ICD-10-CM | POA: Diagnosis not present

## 2017-11-18 DIAGNOSIS — M5137 Other intervertebral disc degeneration, lumbosacral region: Secondary | ICD-10-CM | POA: Diagnosis not present

## 2017-11-18 DIAGNOSIS — M5441 Lumbago with sciatica, right side: Secondary | ICD-10-CM | POA: Diagnosis not present

## 2017-11-18 DIAGNOSIS — M9904 Segmental and somatic dysfunction of sacral region: Secondary | ICD-10-CM | POA: Diagnosis not present

## 2017-11-18 DIAGNOSIS — M9903 Segmental and somatic dysfunction of lumbar region: Secondary | ICD-10-CM | POA: Diagnosis not present

## 2017-12-02 DIAGNOSIS — M5137 Other intervertebral disc degeneration, lumbosacral region: Secondary | ICD-10-CM | POA: Diagnosis not present

## 2017-12-02 DIAGNOSIS — M5136 Other intervertebral disc degeneration, lumbar region: Secondary | ICD-10-CM | POA: Diagnosis not present

## 2017-12-02 DIAGNOSIS — M9904 Segmental and somatic dysfunction of sacral region: Secondary | ICD-10-CM | POA: Diagnosis not present

## 2017-12-02 DIAGNOSIS — M9902 Segmental and somatic dysfunction of thoracic region: Secondary | ICD-10-CM | POA: Diagnosis not present

## 2017-12-02 DIAGNOSIS — M5441 Lumbago with sciatica, right side: Secondary | ICD-10-CM | POA: Diagnosis not present

## 2017-12-02 DIAGNOSIS — M546 Pain in thoracic spine: Secondary | ICD-10-CM | POA: Diagnosis not present

## 2017-12-02 DIAGNOSIS — M9903 Segmental and somatic dysfunction of lumbar region: Secondary | ICD-10-CM | POA: Diagnosis not present

## 2018-01-06 DIAGNOSIS — M5137 Other intervertebral disc degeneration, lumbosacral region: Secondary | ICD-10-CM | POA: Diagnosis not present

## 2018-01-06 DIAGNOSIS — M9903 Segmental and somatic dysfunction of lumbar region: Secondary | ICD-10-CM | POA: Diagnosis not present

## 2018-01-06 DIAGNOSIS — M546 Pain in thoracic spine: Secondary | ICD-10-CM | POA: Diagnosis not present

## 2018-01-06 DIAGNOSIS — M5136 Other intervertebral disc degeneration, lumbar region: Secondary | ICD-10-CM | POA: Diagnosis not present

## 2018-01-06 DIAGNOSIS — M9904 Segmental and somatic dysfunction of sacral region: Secondary | ICD-10-CM | POA: Diagnosis not present

## 2018-01-06 DIAGNOSIS — M5441 Lumbago with sciatica, right side: Secondary | ICD-10-CM | POA: Diagnosis not present

## 2018-01-06 DIAGNOSIS — M9902 Segmental and somatic dysfunction of thoracic region: Secondary | ICD-10-CM | POA: Diagnosis not present

## 2018-02-10 DIAGNOSIS — M546 Pain in thoracic spine: Secondary | ICD-10-CM | POA: Diagnosis not present

## 2018-02-10 DIAGNOSIS — M9904 Segmental and somatic dysfunction of sacral region: Secondary | ICD-10-CM | POA: Diagnosis not present

## 2018-02-10 DIAGNOSIS — M5137 Other intervertebral disc degeneration, lumbosacral region: Secondary | ICD-10-CM | POA: Diagnosis not present

## 2018-02-10 DIAGNOSIS — M5441 Lumbago with sciatica, right side: Secondary | ICD-10-CM | POA: Diagnosis not present

## 2018-02-10 DIAGNOSIS — M5136 Other intervertebral disc degeneration, lumbar region: Secondary | ICD-10-CM | POA: Diagnosis not present

## 2018-02-10 DIAGNOSIS — M9902 Segmental and somatic dysfunction of thoracic region: Secondary | ICD-10-CM | POA: Diagnosis not present

## 2018-02-10 DIAGNOSIS — M9903 Segmental and somatic dysfunction of lumbar region: Secondary | ICD-10-CM | POA: Diagnosis not present

## 2018-03-10 DIAGNOSIS — M9903 Segmental and somatic dysfunction of lumbar region: Secondary | ICD-10-CM | POA: Diagnosis not present

## 2018-03-10 DIAGNOSIS — M5136 Other intervertebral disc degeneration, lumbar region: Secondary | ICD-10-CM | POA: Diagnosis not present

## 2018-03-10 DIAGNOSIS — M5441 Lumbago with sciatica, right side: Secondary | ICD-10-CM | POA: Diagnosis not present

## 2018-03-10 DIAGNOSIS — M9902 Segmental and somatic dysfunction of thoracic region: Secondary | ICD-10-CM | POA: Diagnosis not present

## 2018-03-10 DIAGNOSIS — M5137 Other intervertebral disc degeneration, lumbosacral region: Secondary | ICD-10-CM | POA: Diagnosis not present

## 2018-03-10 DIAGNOSIS — M9904 Segmental and somatic dysfunction of sacral region: Secondary | ICD-10-CM | POA: Diagnosis not present

## 2018-03-10 DIAGNOSIS — M546 Pain in thoracic spine: Secondary | ICD-10-CM | POA: Diagnosis not present

## 2018-04-19 DIAGNOSIS — M5136 Other intervertebral disc degeneration, lumbar region: Secondary | ICD-10-CM | POA: Diagnosis not present

## 2018-04-19 DIAGNOSIS — M9902 Segmental and somatic dysfunction of thoracic region: Secondary | ICD-10-CM | POA: Diagnosis not present

## 2018-04-19 DIAGNOSIS — M5441 Lumbago with sciatica, right side: Secondary | ICD-10-CM | POA: Diagnosis not present

## 2018-04-19 DIAGNOSIS — M5137 Other intervertebral disc degeneration, lumbosacral region: Secondary | ICD-10-CM | POA: Diagnosis not present

## 2018-04-19 DIAGNOSIS — M9904 Segmental and somatic dysfunction of sacral region: Secondary | ICD-10-CM | POA: Diagnosis not present

## 2018-04-19 DIAGNOSIS — M9903 Segmental and somatic dysfunction of lumbar region: Secondary | ICD-10-CM | POA: Diagnosis not present

## 2018-04-19 DIAGNOSIS — M546 Pain in thoracic spine: Secondary | ICD-10-CM | POA: Diagnosis not present

## 2018-05-07 DIAGNOSIS — M419 Scoliosis, unspecified: Secondary | ICD-10-CM | POA: Diagnosis not present

## 2018-05-07 DIAGNOSIS — R030 Elevated blood-pressure reading, without diagnosis of hypertension: Secondary | ICD-10-CM | POA: Diagnosis not present

## 2018-05-07 DIAGNOSIS — Z6826 Body mass index (BMI) 26.0-26.9, adult: Secondary | ICD-10-CM | POA: Diagnosis not present

## 2018-05-07 DIAGNOSIS — E7800 Pure hypercholesterolemia, unspecified: Secondary | ICD-10-CM | POA: Diagnosis not present

## 2018-05-07 DIAGNOSIS — M810 Age-related osteoporosis without current pathological fracture: Secondary | ICD-10-CM | POA: Diagnosis not present

## 2018-05-07 DIAGNOSIS — E559 Vitamin D deficiency, unspecified: Secondary | ICD-10-CM | POA: Diagnosis not present

## 2018-05-07 DIAGNOSIS — Z1389 Encounter for screening for other disorder: Secondary | ICD-10-CM | POA: Diagnosis not present

## 2018-05-12 DIAGNOSIS — M546 Pain in thoracic spine: Secondary | ICD-10-CM | POA: Diagnosis not present

## 2018-05-12 DIAGNOSIS — M5136 Other intervertebral disc degeneration, lumbar region: Secondary | ICD-10-CM | POA: Diagnosis not present

## 2018-05-12 DIAGNOSIS — M5441 Lumbago with sciatica, right side: Secondary | ICD-10-CM | POA: Diagnosis not present

## 2018-05-12 DIAGNOSIS — M9903 Segmental and somatic dysfunction of lumbar region: Secondary | ICD-10-CM | POA: Diagnosis not present

## 2018-05-12 DIAGNOSIS — M9902 Segmental and somatic dysfunction of thoracic region: Secondary | ICD-10-CM | POA: Diagnosis not present

## 2018-05-12 DIAGNOSIS — M5137 Other intervertebral disc degeneration, lumbosacral region: Secondary | ICD-10-CM | POA: Diagnosis not present

## 2018-05-12 DIAGNOSIS — M9904 Segmental and somatic dysfunction of sacral region: Secondary | ICD-10-CM | POA: Diagnosis not present

## 2018-05-25 DIAGNOSIS — I700 Atherosclerosis of aorta: Secondary | ICD-10-CM | POA: Diagnosis not present

## 2018-05-25 DIAGNOSIS — K5730 Diverticulosis of large intestine without perforation or abscess without bleeding: Secondary | ICD-10-CM | POA: Diagnosis not present

## 2018-05-25 DIAGNOSIS — R1084 Generalized abdominal pain: Secondary | ICD-10-CM | POA: Diagnosis not present

## 2018-05-25 DIAGNOSIS — N1330 Unspecified hydronephrosis: Secondary | ICD-10-CM | POA: Diagnosis not present

## 2018-05-27 DIAGNOSIS — M25571 Pain in right ankle and joints of right foot: Secondary | ICD-10-CM | POA: Diagnosis not present

## 2018-05-27 DIAGNOSIS — M25572 Pain in left ankle and joints of left foot: Secondary | ICD-10-CM | POA: Diagnosis not present

## 2018-06-07 ENCOUNTER — Other Ambulatory Visit: Payer: Self-pay | Admitting: Urology

## 2018-06-07 DIAGNOSIS — Q6211 Congenital occlusion of ureteropelvic junction: Principal | ICD-10-CM

## 2018-06-07 DIAGNOSIS — Q6239 Other obstructive defects of renal pelvis and ureter: Secondary | ICD-10-CM

## 2018-06-07 DIAGNOSIS — R1084 Generalized abdominal pain: Secondary | ICD-10-CM | POA: Diagnosis not present

## 2018-06-09 DIAGNOSIS — M9904 Segmental and somatic dysfunction of sacral region: Secondary | ICD-10-CM | POA: Diagnosis not present

## 2018-06-09 DIAGNOSIS — M5136 Other intervertebral disc degeneration, lumbar region: Secondary | ICD-10-CM | POA: Diagnosis not present

## 2018-06-09 DIAGNOSIS — M9902 Segmental and somatic dysfunction of thoracic region: Secondary | ICD-10-CM | POA: Diagnosis not present

## 2018-06-09 DIAGNOSIS — M9903 Segmental and somatic dysfunction of lumbar region: Secondary | ICD-10-CM | POA: Diagnosis not present

## 2018-06-09 DIAGNOSIS — M5137 Other intervertebral disc degeneration, lumbosacral region: Secondary | ICD-10-CM | POA: Diagnosis not present

## 2018-06-09 DIAGNOSIS — M546 Pain in thoracic spine: Secondary | ICD-10-CM | POA: Diagnosis not present

## 2018-06-09 DIAGNOSIS — M5441 Lumbago with sciatica, right side: Secondary | ICD-10-CM | POA: Diagnosis not present

## 2018-06-15 ENCOUNTER — Encounter (HOSPITAL_COMMUNITY)
Admission: RE | Admit: 2018-06-15 | Discharge: 2018-06-15 | Disposition: A | Discharge status: Home / Self Care | Payer: Medicare Other | Source: Ambulatory Visit | Attending: Urology | Admitting: Urology

## 2018-06-15 ENCOUNTER — Encounter (HOSPITAL_COMMUNITY): Payer: Self-pay | Admitting: Radiology

## 2018-06-15 DIAGNOSIS — Q6211 Congenital occlusion of ureteropelvic junction: Secondary | ICD-10-CM | POA: Insufficient documentation

## 2018-06-15 DIAGNOSIS — Q6239 Other obstructive defects of renal pelvis and ureter: Secondary | ICD-10-CM | POA: Diagnosis not present

## 2018-06-15 MED ORDER — FUROSEMIDE 10 MG/ML IJ SOLN
36.0000 mg | Freq: Once | INTRAMUSCULAR | Status: AC
  Administered 2018-06-15: 36 mg via INTRAVENOUS

## 2018-06-15 MED ORDER — FUROSEMIDE 10 MG/ML IJ SOLN
INTRAMUSCULAR | Status: AC
  Filled 2018-06-15: qty 4

## 2018-06-15 MED ORDER — TECHNETIUM TC 99M MERTIATIDE
5.1000 | Freq: Once | INTRAVENOUS | Status: AC | PRN
  Administered 2018-06-15: 5.1 via INTRAVENOUS

## 2018-06-22 DIAGNOSIS — N270 Small kidney, unilateral: Secondary | ICD-10-CM | POA: Diagnosis not present

## 2018-06-22 DIAGNOSIS — R1084 Generalized abdominal pain: Secondary | ICD-10-CM | POA: Diagnosis not present

## 2018-07-05 DIAGNOSIS — Q6211 Congenital occlusion of ureteropelvic junction: Secondary | ICD-10-CM | POA: Diagnosis not present

## 2018-07-21 DIAGNOSIS — M9902 Segmental and somatic dysfunction of thoracic region: Secondary | ICD-10-CM | POA: Diagnosis not present

## 2018-07-21 DIAGNOSIS — M5136 Other intervertebral disc degeneration, lumbar region: Secondary | ICD-10-CM | POA: Diagnosis not present

## 2018-07-21 DIAGNOSIS — M5441 Lumbago with sciatica, right side: Secondary | ICD-10-CM | POA: Diagnosis not present

## 2018-07-21 DIAGNOSIS — M5137 Other intervertebral disc degeneration, lumbosacral region: Secondary | ICD-10-CM | POA: Diagnosis not present

## 2018-07-21 DIAGNOSIS — M546 Pain in thoracic spine: Secondary | ICD-10-CM | POA: Diagnosis not present

## 2018-07-21 DIAGNOSIS — M9903 Segmental and somatic dysfunction of lumbar region: Secondary | ICD-10-CM | POA: Diagnosis not present

## 2018-07-21 DIAGNOSIS — M9904 Segmental and somatic dysfunction of sacral region: Secondary | ICD-10-CM | POA: Diagnosis not present

## 2018-07-21 DIAGNOSIS — N135 Crossing vessel and stricture of ureter without hydronephrosis: Secondary | ICD-10-CM | POA: Diagnosis not present

## 2018-07-21 DIAGNOSIS — N1330 Unspecified hydronephrosis: Secondary | ICD-10-CM | POA: Diagnosis not present

## 2018-07-30 DIAGNOSIS — Q6211 Congenital occlusion of ureteropelvic junction: Secondary | ICD-10-CM | POA: Diagnosis not present

## 2018-07-30 DIAGNOSIS — N182 Chronic kidney disease, stage 2 (mild): Secondary | ICD-10-CM | POA: Diagnosis not present

## 2018-08-11 DIAGNOSIS — M546 Pain in thoracic spine: Secondary | ICD-10-CM | POA: Diagnosis not present

## 2018-08-11 DIAGNOSIS — M9904 Segmental and somatic dysfunction of sacral region: Secondary | ICD-10-CM | POA: Diagnosis not present

## 2018-08-11 DIAGNOSIS — M5136 Other intervertebral disc degeneration, lumbar region: Secondary | ICD-10-CM | POA: Diagnosis not present

## 2018-08-11 DIAGNOSIS — M5137 Other intervertebral disc degeneration, lumbosacral region: Secondary | ICD-10-CM | POA: Diagnosis not present

## 2018-08-11 DIAGNOSIS — M5441 Lumbago with sciatica, right side: Secondary | ICD-10-CM | POA: Diagnosis not present

## 2018-08-11 DIAGNOSIS — M9902 Segmental and somatic dysfunction of thoracic region: Secondary | ICD-10-CM | POA: Diagnosis not present

## 2018-08-11 DIAGNOSIS — M9903 Segmental and somatic dysfunction of lumbar region: Secondary | ICD-10-CM | POA: Diagnosis not present

## 2018-09-13 DIAGNOSIS — M5136 Other intervertebral disc degeneration, lumbar region: Secondary | ICD-10-CM | POA: Diagnosis not present

## 2018-09-13 DIAGNOSIS — M9904 Segmental and somatic dysfunction of sacral region: Secondary | ICD-10-CM | POA: Diagnosis not present

## 2018-09-13 DIAGNOSIS — M5441 Lumbago with sciatica, right side: Secondary | ICD-10-CM | POA: Diagnosis not present

## 2018-09-13 DIAGNOSIS — M5137 Other intervertebral disc degeneration, lumbosacral region: Secondary | ICD-10-CM | POA: Diagnosis not present

## 2018-09-13 DIAGNOSIS — M546 Pain in thoracic spine: Secondary | ICD-10-CM | POA: Diagnosis not present

## 2018-09-13 DIAGNOSIS — M9903 Segmental and somatic dysfunction of lumbar region: Secondary | ICD-10-CM | POA: Diagnosis not present

## 2018-09-13 DIAGNOSIS — M9902 Segmental and somatic dysfunction of thoracic region: Secondary | ICD-10-CM | POA: Diagnosis not present

## 2018-10-27 DIAGNOSIS — M5441 Lumbago with sciatica, right side: Secondary | ICD-10-CM | POA: Diagnosis not present

## 2018-10-27 DIAGNOSIS — M9904 Segmental and somatic dysfunction of sacral region: Secondary | ICD-10-CM | POA: Diagnosis not present

## 2018-10-27 DIAGNOSIS — M546 Pain in thoracic spine: Secondary | ICD-10-CM | POA: Diagnosis not present

## 2018-10-27 DIAGNOSIS — M5136 Other intervertebral disc degeneration, lumbar region: Secondary | ICD-10-CM | POA: Diagnosis not present

## 2018-10-27 DIAGNOSIS — M9903 Segmental and somatic dysfunction of lumbar region: Secondary | ICD-10-CM | POA: Diagnosis not present

## 2018-10-27 DIAGNOSIS — M5137 Other intervertebral disc degeneration, lumbosacral region: Secondary | ICD-10-CM | POA: Diagnosis not present

## 2018-10-27 DIAGNOSIS — M9902 Segmental and somatic dysfunction of thoracic region: Secondary | ICD-10-CM | POA: Diagnosis not present

## 2018-11-09 DIAGNOSIS — M5136 Other intervertebral disc degeneration, lumbar region: Secondary | ICD-10-CM | POA: Diagnosis not present

## 2018-11-09 DIAGNOSIS — M9904 Segmental and somatic dysfunction of sacral region: Secondary | ICD-10-CM | POA: Diagnosis not present

## 2018-11-09 DIAGNOSIS — M546 Pain in thoracic spine: Secondary | ICD-10-CM | POA: Diagnosis not present

## 2018-11-09 DIAGNOSIS — M5137 Other intervertebral disc degeneration, lumbosacral region: Secondary | ICD-10-CM | POA: Diagnosis not present

## 2018-11-09 DIAGNOSIS — M9903 Segmental and somatic dysfunction of lumbar region: Secondary | ICD-10-CM | POA: Diagnosis not present

## 2018-11-09 DIAGNOSIS — M5441 Lumbago with sciatica, right side: Secondary | ICD-10-CM | POA: Diagnosis not present

## 2018-11-09 DIAGNOSIS — M9902 Segmental and somatic dysfunction of thoracic region: Secondary | ICD-10-CM | POA: Diagnosis not present

## 2018-11-30 ENCOUNTER — Telehealth: Payer: Self-pay | Admitting: Physical Therapy

## 2018-11-30 DIAGNOSIS — Q6211 Congenital occlusion of ureteropelvic junction: Secondary | ICD-10-CM | POA: Diagnosis not present

## 2018-11-30 NOTE — Telephone Encounter (Signed)
Patient came in and stated she used to see you in [place] and requests more orthotics. Can you please advise. Contact patient at [phone]. I advised that she may need a referral to see Lauren and offered her to see Dr. Paulla Fore to discuss having orthotics if you were not an option. She stated she only wanted Lauren and wanted the orthotics today. I advised it would not be done today however, I would communicate this with Lauren to advise.

## 2018-12-01 DIAGNOSIS — M5136 Other intervertebral disc degeneration, lumbar region: Secondary | ICD-10-CM | POA: Diagnosis not present

## 2018-12-01 DIAGNOSIS — M9904 Segmental and somatic dysfunction of sacral region: Secondary | ICD-10-CM | POA: Diagnosis not present

## 2018-12-01 DIAGNOSIS — M5137 Other intervertebral disc degeneration, lumbosacral region: Secondary | ICD-10-CM | POA: Diagnosis not present

## 2018-12-01 DIAGNOSIS — M9902 Segmental and somatic dysfunction of thoracic region: Secondary | ICD-10-CM | POA: Diagnosis not present

## 2018-12-01 DIAGNOSIS — M546 Pain in thoracic spine: Secondary | ICD-10-CM | POA: Diagnosis not present

## 2018-12-01 DIAGNOSIS — M9903 Segmental and somatic dysfunction of lumbar region: Secondary | ICD-10-CM | POA: Diagnosis not present

## 2018-12-01 DIAGNOSIS — M5441 Lumbago with sciatica, right side: Secondary | ICD-10-CM | POA: Diagnosis not present

## 2019-01-12 DIAGNOSIS — M5441 Lumbago with sciatica, right side: Secondary | ICD-10-CM | POA: Diagnosis not present

## 2019-01-12 DIAGNOSIS — M5137 Other intervertebral disc degeneration, lumbosacral region: Secondary | ICD-10-CM | POA: Diagnosis not present

## 2019-01-12 DIAGNOSIS — M9902 Segmental and somatic dysfunction of thoracic region: Secondary | ICD-10-CM | POA: Diagnosis not present

## 2019-01-12 DIAGNOSIS — M9903 Segmental and somatic dysfunction of lumbar region: Secondary | ICD-10-CM | POA: Diagnosis not present

## 2019-01-12 DIAGNOSIS — M9904 Segmental and somatic dysfunction of sacral region: Secondary | ICD-10-CM | POA: Diagnosis not present

## 2019-01-12 DIAGNOSIS — M546 Pain in thoracic spine: Secondary | ICD-10-CM | POA: Diagnosis not present

## 2019-01-12 DIAGNOSIS — M5136 Other intervertebral disc degeneration, lumbar region: Secondary | ICD-10-CM | POA: Diagnosis not present

## 2019-01-19 DIAGNOSIS — R1084 Generalized abdominal pain: Secondary | ICD-10-CM | POA: Diagnosis not present

## 2019-01-19 DIAGNOSIS — N270 Small kidney, unilateral: Secondary | ICD-10-CM | POA: Diagnosis not present

## 2019-01-19 DIAGNOSIS — Q6211 Congenital occlusion of ureteropelvic junction: Secondary | ICD-10-CM | POA: Diagnosis not present

## 2019-01-20 ENCOUNTER — Other Ambulatory Visit (HOSPITAL_COMMUNITY): Payer: Self-pay | Admitting: Urology

## 2019-01-20 ENCOUNTER — Other Ambulatory Visit: Payer: Self-pay | Admitting: Urology

## 2019-01-20 DIAGNOSIS — Q6239 Other obstructive defects of renal pelvis and ureter: Secondary | ICD-10-CM

## 2019-01-20 DIAGNOSIS — Q6211 Congenital occlusion of ureteropelvic junction: Principal | ICD-10-CM

## 2019-02-02 ENCOUNTER — Encounter (HOSPITAL_COMMUNITY): Payer: Self-pay

## 2019-02-02 ENCOUNTER — Encounter (HOSPITAL_COMMUNITY)
Admission: RE | Admit: 2019-02-02 | Discharge: 2019-02-02 | Disposition: A | Discharge status: Home / Self Care | Payer: Medicare Other | Source: Ambulatory Visit | Attending: Urology | Admitting: Urology

## 2019-02-02 DIAGNOSIS — Q6239 Other obstructive defects of renal pelvis and ureter: Secondary | ICD-10-CM | POA: Diagnosis not present

## 2019-02-02 DIAGNOSIS — Q6211 Congenital occlusion of ureteropelvic junction: Secondary | ICD-10-CM | POA: Insufficient documentation

## 2019-02-02 MED ORDER — TECHNETIUM TC 99M MERTIATIDE
5.1200 | Freq: Once | INTRAVENOUS | Status: AC | PRN
  Administered 2019-02-02: 5.12 via INTRAVENOUS

## 2019-02-02 MED ORDER — FUROSEMIDE 10 MG/ML IJ SOLN: 37.0000 mg | Freq: Once | INTRAMUSCULAR | Status: DC

## 2019-02-02 MED ORDER — FUROSEMIDE 10 MG/ML IJ SOLN
INTRAMUSCULAR | Status: AC
  Filled 2019-02-02: qty 4

## 2019-02-09 DIAGNOSIS — M546 Pain in thoracic spine: Secondary | ICD-10-CM | POA: Diagnosis not present

## 2019-02-09 DIAGNOSIS — M9902 Segmental and somatic dysfunction of thoracic region: Secondary | ICD-10-CM | POA: Diagnosis not present

## 2019-02-09 DIAGNOSIS — M9903 Segmental and somatic dysfunction of lumbar region: Secondary | ICD-10-CM | POA: Diagnosis not present

## 2019-02-09 DIAGNOSIS — M5441 Lumbago with sciatica, right side: Secondary | ICD-10-CM | POA: Diagnosis not present

## 2019-02-09 DIAGNOSIS — M5137 Other intervertebral disc degeneration, lumbosacral region: Secondary | ICD-10-CM | POA: Diagnosis not present

## 2019-02-09 DIAGNOSIS — M5136 Other intervertebral disc degeneration, lumbar region: Secondary | ICD-10-CM | POA: Diagnosis not present

## 2019-02-09 DIAGNOSIS — M9904 Segmental and somatic dysfunction of sacral region: Secondary | ICD-10-CM | POA: Diagnosis not present

## 2019-02-10 DIAGNOSIS — Q6211 Congenital occlusion of ureteropelvic junction: Secondary | ICD-10-CM | POA: Diagnosis not present

## 2019-02-10 DIAGNOSIS — N270 Small kidney, unilateral: Secondary | ICD-10-CM | POA: Diagnosis not present

## 2019-02-10 DIAGNOSIS — R1084 Generalized abdominal pain: Secondary | ICD-10-CM | POA: Diagnosis not present

## 2019-02-21 DIAGNOSIS — N270 Small kidney, unilateral: Secondary | ICD-10-CM | POA: Diagnosis not present

## 2019-02-21 DIAGNOSIS — Q6211 Congenital occlusion of ureteropelvic junction: Secondary | ICD-10-CM | POA: Diagnosis not present

## 2019-03-02 DIAGNOSIS — N1330 Unspecified hydronephrosis: Secondary | ICD-10-CM | POA: Diagnosis not present

## 2019-04-13 DIAGNOSIS — M546 Pain in thoracic spine: Secondary | ICD-10-CM | POA: Diagnosis not present

## 2019-04-13 DIAGNOSIS — M5136 Other intervertebral disc degeneration, lumbar region: Secondary | ICD-10-CM | POA: Diagnosis not present

## 2019-04-13 DIAGNOSIS — M9904 Segmental and somatic dysfunction of sacral region: Secondary | ICD-10-CM | POA: Diagnosis not present

## 2019-04-13 DIAGNOSIS — M5137 Other intervertebral disc degeneration, lumbosacral region: Secondary | ICD-10-CM | POA: Diagnosis not present

## 2019-04-13 DIAGNOSIS — M9903 Segmental and somatic dysfunction of lumbar region: Secondary | ICD-10-CM | POA: Diagnosis not present

## 2019-04-13 DIAGNOSIS — M9902 Segmental and somatic dysfunction of thoracic region: Secondary | ICD-10-CM | POA: Diagnosis not present

## 2019-04-13 DIAGNOSIS — M5441 Lumbago with sciatica, right side: Secondary | ICD-10-CM | POA: Diagnosis not present

## 2019-05-03 DIAGNOSIS — N1330 Unspecified hydronephrosis: Secondary | ICD-10-CM | POA: Diagnosis not present

## 2019-05-04 ENCOUNTER — Other Ambulatory Visit (HOSPITAL_COMMUNITY): Payer: Self-pay | Admitting: Urology

## 2019-05-04 ENCOUNTER — Other Ambulatory Visit: Payer: Self-pay | Admitting: Urology

## 2019-05-04 DIAGNOSIS — Q6239 Other obstructive defects of renal pelvis and ureter: Secondary | ICD-10-CM

## 2019-05-11 DIAGNOSIS — M546 Pain in thoracic spine: Secondary | ICD-10-CM | POA: Diagnosis not present

## 2019-05-11 DIAGNOSIS — M9904 Segmental and somatic dysfunction of sacral region: Secondary | ICD-10-CM | POA: Diagnosis not present

## 2019-05-11 DIAGNOSIS — M9903 Segmental and somatic dysfunction of lumbar region: Secondary | ICD-10-CM | POA: Diagnosis not present

## 2019-05-11 DIAGNOSIS — M5136 Other intervertebral disc degeneration, lumbar region: Secondary | ICD-10-CM | POA: Diagnosis not present

## 2019-05-11 DIAGNOSIS — M5137 Other intervertebral disc degeneration, lumbosacral region: Secondary | ICD-10-CM | POA: Diagnosis not present

## 2019-05-11 DIAGNOSIS — M5441 Lumbago with sciatica, right side: Secondary | ICD-10-CM | POA: Diagnosis not present

## 2019-05-11 DIAGNOSIS — M9902 Segmental and somatic dysfunction of thoracic region: Secondary | ICD-10-CM | POA: Diagnosis not present

## 2019-06-02 DIAGNOSIS — M546 Pain in thoracic spine: Secondary | ICD-10-CM | POA: Diagnosis not present

## 2019-06-02 DIAGNOSIS — M5441 Lumbago with sciatica, right side: Secondary | ICD-10-CM | POA: Diagnosis not present

## 2019-06-02 DIAGNOSIS — M9902 Segmental and somatic dysfunction of thoracic region: Secondary | ICD-10-CM | POA: Diagnosis not present

## 2019-06-02 DIAGNOSIS — M9903 Segmental and somatic dysfunction of lumbar region: Secondary | ICD-10-CM | POA: Diagnosis not present

## 2019-06-02 DIAGNOSIS — M5137 Other intervertebral disc degeneration, lumbosacral region: Secondary | ICD-10-CM | POA: Diagnosis not present

## 2019-06-02 DIAGNOSIS — M9904 Segmental and somatic dysfunction of sacral region: Secondary | ICD-10-CM | POA: Diagnosis not present

## 2019-06-02 DIAGNOSIS — M5136 Other intervertebral disc degeneration, lumbar region: Secondary | ICD-10-CM | POA: Diagnosis not present

## 2019-06-08 DIAGNOSIS — M5136 Other intervertebral disc degeneration, lumbar region: Secondary | ICD-10-CM | POA: Diagnosis not present

## 2019-06-08 DIAGNOSIS — M9902 Segmental and somatic dysfunction of thoracic region: Secondary | ICD-10-CM | POA: Diagnosis not present

## 2019-06-08 DIAGNOSIS — M9904 Segmental and somatic dysfunction of sacral region: Secondary | ICD-10-CM | POA: Diagnosis not present

## 2019-06-08 DIAGNOSIS — M5441 Lumbago with sciatica, right side: Secondary | ICD-10-CM | POA: Diagnosis not present

## 2019-06-08 DIAGNOSIS — M546 Pain in thoracic spine: Secondary | ICD-10-CM | POA: Diagnosis not present

## 2019-06-08 DIAGNOSIS — M9903 Segmental and somatic dysfunction of lumbar region: Secondary | ICD-10-CM | POA: Diagnosis not present

## 2019-06-08 DIAGNOSIS — M5137 Other intervertebral disc degeneration, lumbosacral region: Secondary | ICD-10-CM | POA: Diagnosis not present

## 2019-06-10 ENCOUNTER — Telehealth: Payer: Self-pay | Admitting: Gastroenterology

## 2019-06-10 NOTE — Telephone Encounter (Signed)
Patient came to fill out a medical release form to have previous colonoscopy report faxed to [place]. Patient would like to see Dr. Fuller Plan when we receive records.

## 2019-06-21 NOTE — Telephone Encounter (Signed)
I have scheduled an appointment for the patient her last procedure was over 10 yrs ago and we can have her sign the consent on the day of her appointment

## 2019-06-30 ENCOUNTER — Telehealth: Payer: Self-pay | Admitting: Gastroenterology

## 2019-06-30 NOTE — Telephone Encounter (Signed)
ROI fax to [facility]  [place]  for patient records.

## 2019-07-13 DIAGNOSIS — M9903 Segmental and somatic dysfunction of lumbar region: Secondary | ICD-10-CM | POA: Diagnosis not present

## 2019-07-13 DIAGNOSIS — M9902 Segmental and somatic dysfunction of thoracic region: Secondary | ICD-10-CM | POA: Diagnosis not present

## 2019-07-13 DIAGNOSIS — M5137 Other intervertebral disc degeneration, lumbosacral region: Secondary | ICD-10-CM | POA: Diagnosis not present

## 2019-07-13 DIAGNOSIS — M5441 Lumbago with sciatica, right side: Secondary | ICD-10-CM | POA: Diagnosis not present

## 2019-07-13 DIAGNOSIS — M5136 Other intervertebral disc degeneration, lumbar region: Secondary | ICD-10-CM | POA: Diagnosis not present

## 2019-07-13 DIAGNOSIS — M9904 Segmental and somatic dysfunction of sacral region: Secondary | ICD-10-CM | POA: Diagnosis not present

## 2019-07-13 DIAGNOSIS — M546 Pain in thoracic spine: Secondary | ICD-10-CM | POA: Diagnosis not present

## 2019-07-20 ENCOUNTER — Ambulatory Visit (INDEPENDENT_AMBULATORY_CARE_PROVIDER_SITE_OTHER): Payer: Medicare Other | Admitting: Gastroenterology

## 2019-07-20 ENCOUNTER — Encounter: Payer: Self-pay | Admitting: Gastroenterology

## 2019-07-20 VITALS — BP 144/80 | HR 80 | Temp 97.6°F | Ht 65.75 in | Wt 158.4 lb

## 2019-07-20 DIAGNOSIS — Z800 Family history of malignant neoplasm of digestive organs: Secondary | ICD-10-CM | POA: Diagnosis not present

## 2019-07-20 DIAGNOSIS — Z86010 Personal history of colonic polyps: Secondary | ICD-10-CM | POA: Diagnosis not present

## 2019-07-20 DIAGNOSIS — R1012 Left upper quadrant pain: Secondary | ICD-10-CM | POA: Diagnosis not present

## 2019-07-20 MED ORDER — NA SULFATE-K SULFATE-MG SULF 17.5-3.13-1.6 GM/177ML PO SOLN: 1.0000 | Freq: Once | ORAL | 0 refills | Status: AC

## 2019-07-20 NOTE — Patient Instructions (Signed)
You have been scheduled for a colonoscopy. Please follow written instructions given to you at your visit today.  Please pick up your prep supplies at the pharmacy within the next 1-3 days. If you use inhalers (even only as needed), please bring them with you on the day of your procedure.  Thank you for choosing me and [facility].  Malcolm T. Stark, Jr., MD., FACG  
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
## 2019-07-27 ENCOUNTER — Telehealth: Payer: Self-pay | Admitting: Gastroenterology

## 2019-07-27 NOTE — Telephone Encounter (Signed)

## 2019-07-28 ENCOUNTER — Ambulatory Visit (AMBULATORY_SURGERY_CENTER): Payer: Medicare Other | Admitting: Gastroenterology

## 2019-07-28 ENCOUNTER — Other Ambulatory Visit: Payer: Self-pay

## 2019-07-28 ENCOUNTER — Encounter: Payer: Self-pay | Admitting: Gastroenterology

## 2019-07-28 VITALS — BP 132/75 | HR 67 | Temp 99.1°F | Resp 12 | Ht 65.0 in | Wt 158.0 lb

## 2019-07-28 DIAGNOSIS — Z800 Family history of malignant neoplasm of digestive organs: Secondary | ICD-10-CM | POA: Diagnosis not present

## 2019-07-28 DIAGNOSIS — R1012 Left upper quadrant pain: Secondary | ICD-10-CM | POA: Diagnosis not present

## 2019-07-28 DIAGNOSIS — Z86010 Personal history of colonic polyps: Secondary | ICD-10-CM | POA: Diagnosis not present

## 2019-07-28 DIAGNOSIS — G8929 Other chronic pain: Secondary | ICD-10-CM

## 2019-07-28 MED ORDER — SODIUM CHLORIDE 0.9 % IV SOLN: 500.0000 mL | Freq: Once | INTRAVENOUS | Status: DC

## 2019-07-28 NOTE — Op Note (Signed)
Joanne Greene Patient Name: Sangita Zani Procedure Date: 07/28/2019 9:01 AM MRN: [id number] Endoscopist: Ladene Artist , MD Age: 73 Referring MD:  Date of Birth: 31-Mar-1946 Gender: Female Account #: [id number] Procedure:                Colonoscopy Indications:              Surveillance: Personal history of colonic polyps                            (unknown histology) on last colonoscopy more than 5                            years ago. Family history of colon cancer, brother Medicines:                Monitored Anesthesia Care Procedure:                Pre-Anesthesia Assessment:                           - Prior to the procedure, a History and Physical                            was performed, and patient medications and                            allergies were reviewed. The patient's tolerance of                            previous anesthesia was also reviewed. The risks                            and benefits of the procedure and the sedation                            options and risks were discussed with the patient.                            All questions were answered, and informed consent                            was obtained. Prior Anticoagulants: The patient has                            taken no previous anticoagulant or antiplatelet                            agents. ASA Grade Assessment: II - A patient with                            mild systemic disease. After reviewing the risks                            and benefits, the patient was deemed in  satisfactory condition to undergo the procedure.                           After obtaining informed consent, the colonoscope                            was passed under direct vision. Throughout the                            procedure, the patient's blood pressure, pulse, and                            oxygen saturations were monitored continuously. The   Colonoscope was introduced through the anus and                            advanced to the the cecum, identified by                            appendiceal orifice and ileocecal valve. The                            ileocecal valve, appendiceal orifice, and rectum                            were photographed. The quality of the bowel                            preparation was good. The colonoscopy was performed                            without difficulty. The patient tolerated the                            procedure well. Scope In: 9:08:16 AM Scope Out: 9:22:50 AM Scope Withdrawal Time: 0 hours 11 minutes 34 seconds  Total Procedure Duration: 0 hours 14 minutes 34 seconds  Findings:                 External skin tags were found on perianal exam.                           Multiple medium-mouthed diverticula were found in                            the left colon. There was narrowing of the colon in                            association with the diverticular opening. There                            was evidence of diverticular spasm. There was no                            evidence of diverticular bleeding.  The exam was otherwise without abnormality on                            direct and retroflexion views. Complications:            No immediate complications. Estimated blood loss:                            None. Estimated Blood Loss:     Estimated blood loss: none. Impression:               - External skin tags found on perianal exam.                           - Moderate diverticulosis in the left colon.                           - The examination was otherwise normal on direct                            and retroflexion views.                           - No specimens collected. Recommendation:           - Repeat colonoscopy in 5 years for screening /                            surveillance.                           - Patient has a contact number  available for                            emergencies. The signs and symptoms of potential                            delayed complications were discussed with the                            patient. Return to normal activities tomorrow.                            Written discharge instructions were provided to the                            patient.                           - High fiber diet.                           - Continue present medications. Ladene Artist, MD 07/28/2019 9:29:34 AM This report has been signed electronically.

## 2019-07-28 NOTE — Patient Instructions (Signed)
HANDOUTS given for diverticulosis and high fiber diet.  YOU HAD AN ENDOSCOPIC PROCEDURE TODAY AT [facility]:   Refer to the procedure report that was given to you for any specific questions about what was found during the examination.  If the procedure report does not answer your questions, please call your gastroenterologist to clarify.  If you requested that your care partner not be given the details of your procedure findings, then the procedure report has been included in a sealed envelope for you to review at your convenience later.  YOU SHOULD EXPECT: Some feelings of bloating in the abdomen. Passage of more gas than usual.  Walking can help get rid of the air that was put into your GI tract during the procedure and reduce the bloating. If you had a lower endoscopy (such as a colonoscopy or flexible sigmoidoscopy) you may notice spotting of blood in your stool or on the toilet paper. If you underwent a bowel prep for your procedure, you may not have a normal bowel movement for a few days.  Please Note:  You might notice some irritation and congestion in your nose or some drainage.  This is from the oxygen used during your procedure.  There is no need for concern and it should clear up in a day or so.  SYMPTOMS TO REPORT IMMEDIATELY:   Following lower endoscopy (colonoscopy or flexible sigmoidoscopy):  Excessive amounts of blood in the stool  Significant tenderness or worsening of abdominal pains  Swelling of the abdomen that is new, acute  Fever of 100F or higher  For urgent or emergent issues, a gastroenterologist can be reached at any hour by calling [phone].   DIET:  We do recommend a small meal at first, but then you may proceed to your regular diet.  Drink plenty of fluids but you should avoid alcoholic beverages for 24 hours.  ACTIVITY:  You should plan to take it easy for the rest of today and you should NOT DRIVE or use heavy machinery until tomorrow  (because of the sedation medicines used during the test).    FOLLOW UP: Our staff will call the number listed on your records 48-72 hours following your procedure to check on you and address any questions or concerns that you may have regarding the information given to you following your procedure. If we do not reach you, we will leave a message.  We will attempt to reach you two times.  During this call, we will ask if you have developed any symptoms of COVID 19. If you develop any symptoms (ie: fever, flu-like symptoms, shortness of breath, cough etc.) before then, please call [phone].  If you test positive for Covid 19 in the 2 weeks post procedure, please call and report this information to [place].    If any biopsies were taken you will be contacted by phone or by letter within the next 1-3 weeks.  Please call us at [phone] if you have not heard about the biopsies in 3 weeks.    SIGNATURES/CONFIDENTIALITY: You and/or your care partner have signed paperwork which will be entered into your electronic medical record.  These signatures attest to the fact that that the information above on your After Visit Summary has been reviewed and is understood.  Full responsibility of the confidentiality of this discharge information lies with you and/or your care-partner.

## 2019-07-28 NOTE — Progress Notes (Signed)
Temp and VS taken by CW

## 2019-07-28 NOTE — Progress Notes (Signed)
PT taken to PACU. Monitors in place. VSS. Report given to RN. 

## 2019-08-01 ENCOUNTER — Telehealth: Payer: Self-pay | Admitting: *Deleted

## 2019-08-01 NOTE — Telephone Encounter (Signed)
  Follow up Call-  Call back number 07/28/2019  Post procedure Call Back phone  # [phone]  Permission to leave phone message Yes  Some recent data might be hidden     Patient questions:  Do you have a fever, pain , or abdominal swelling? No. Pain Score  0 *  Have you tolerated food without any problems? Yes.    Have you been able to return to your normal activities? Yes.    Do you have any questions about your discharge instructions: Diet   No. Medications  No. Follow up visit  No.  Do you have questions or concerns about your Care? No.  Actions: * If pain score is 4 or above: No action needed, pain <4.  Have you developed a fever since your procedure? NO  2.   Have you had an respiratory symptoms (SOB or cough) since your procedure? NO  3.   Have you tested positive for COVID 19 since your procedure NO  4.   Have you had any family members/close contacts diagnosed with the COVID 19 since your procedure?  NO   If yes to any of these questions please route to Joylene John, RN and Alphonsa Gin, RN.

## 2019-08-09 DIAGNOSIS — M4850XA Collapsed vertebra, not elsewhere classified, site unspecified, initial encounter for fracture: Secondary | ICD-10-CM | POA: Diagnosis not present

## 2019-08-09 DIAGNOSIS — Z1389 Encounter for screening for other disorder: Secondary | ICD-10-CM | POA: Diagnosis not present

## 2019-08-09 DIAGNOSIS — N1330 Unspecified hydronephrosis: Secondary | ICD-10-CM | POA: Diagnosis not present

## 2019-08-09 DIAGNOSIS — M810 Age-related osteoporosis without current pathological fracture: Secondary | ICD-10-CM | POA: Diagnosis not present

## 2019-08-09 DIAGNOSIS — Z6824 Body mass index (BMI) 24.0-24.9, adult: Secondary | ICD-10-CM | POA: Diagnosis not present

## 2019-08-09 DIAGNOSIS — E7800 Pure hypercholesterolemia, unspecified: Secondary | ICD-10-CM | POA: Diagnosis not present

## 2019-08-09 DIAGNOSIS — N261 Atrophy of kidney (terminal): Secondary | ICD-10-CM | POA: Diagnosis not present

## 2019-08-09 DIAGNOSIS — M419 Scoliosis, unspecified: Secondary | ICD-10-CM | POA: Diagnosis not present

## 2019-08-15 DIAGNOSIS — Z1231 Encounter for screening mammogram for malignant neoplasm of breast: Secondary | ICD-10-CM | POA: Diagnosis not present

## 2019-08-17 DIAGNOSIS — M5136 Other intervertebral disc degeneration, lumbar region: Secondary | ICD-10-CM | POA: Diagnosis not present

## 2019-08-17 DIAGNOSIS — M5441 Lumbago with sciatica, right side: Secondary | ICD-10-CM | POA: Diagnosis not present

## 2019-08-17 DIAGNOSIS — M5137 Other intervertebral disc degeneration, lumbosacral region: Secondary | ICD-10-CM | POA: Diagnosis not present

## 2019-08-17 DIAGNOSIS — M546 Pain in thoracic spine: Secondary | ICD-10-CM | POA: Diagnosis not present

## 2019-08-17 DIAGNOSIS — M9904 Segmental and somatic dysfunction of sacral region: Secondary | ICD-10-CM | POA: Diagnosis not present

## 2019-08-17 DIAGNOSIS — M9903 Segmental and somatic dysfunction of lumbar region: Secondary | ICD-10-CM | POA: Diagnosis not present

## 2019-08-17 DIAGNOSIS — M9902 Segmental and somatic dysfunction of thoracic region: Secondary | ICD-10-CM | POA: Diagnosis not present

## 2019-08-26 DIAGNOSIS — Q6211 Congenital occlusion of ureteropelvic junction: Secondary | ICD-10-CM | POA: Diagnosis not present

## 2019-08-26 DIAGNOSIS — N135 Crossing vessel and stricture of ureter without hydronephrosis: Secondary | ICD-10-CM | POA: Diagnosis not present

## 2019-08-30 ENCOUNTER — Other Ambulatory Visit: Payer: Self-pay

## 2019-08-30 ENCOUNTER — Ambulatory Visit (HOSPITAL_COMMUNITY)
Admission: RE | Admit: 2019-08-30 | Discharge: 2019-08-30 | Disposition: A | Discharge status: Home / Self Care | Payer: Medicare Other | Source: Ambulatory Visit | Attending: Urology | Admitting: Urology

## 2019-08-30 DIAGNOSIS — Q6211 Congenital occlusion of ureteropelvic junction: Secondary | ICD-10-CM | POA: Diagnosis not present

## 2019-08-30 DIAGNOSIS — Q6239 Other obstructive defects of renal pelvis and ureter: Secondary | ICD-10-CM

## 2019-08-30 MED ORDER — FUROSEMIDE 10 MG/ML IJ SOLN
INTRAMUSCULAR | Status: AC
  Filled 2019-08-30: qty 4

## 2019-08-30 MED ORDER — TECHNETIUM TC 99M MERTIATIDE
4.7000 | Freq: Once | INTRAVENOUS | Status: AC
  Administered 2019-08-30: 4.7 via INTRAVENOUS

## 2019-08-30 MED ORDER — FUROSEMIDE 10 MG/ML IJ SOLN
36.0000 mg | Freq: Once | INTRAMUSCULAR | Status: AC
  Administered 2019-08-30: 11:00:00 36 mg via INTRAVENOUS

## 2019-09-05 DIAGNOSIS — N270 Small kidney, unilateral: Secondary | ICD-10-CM | POA: Diagnosis not present

## 2019-09-05 DIAGNOSIS — R1084 Generalized abdominal pain: Secondary | ICD-10-CM | POA: Diagnosis not present

## 2019-09-05 DIAGNOSIS — Q6211 Congenital occlusion of ureteropelvic junction: Secondary | ICD-10-CM | POA: Diagnosis not present

## 2019-09-14 DIAGNOSIS — M5136 Other intervertebral disc degeneration, lumbar region: Secondary | ICD-10-CM | POA: Diagnosis not present

## 2019-09-14 DIAGNOSIS — M5137 Other intervertebral disc degeneration, lumbosacral region: Secondary | ICD-10-CM | POA: Diagnosis not present

## 2019-09-14 DIAGNOSIS — M9904 Segmental and somatic dysfunction of sacral region: Secondary | ICD-10-CM | POA: Diagnosis not present

## 2019-09-14 DIAGNOSIS — M9903 Segmental and somatic dysfunction of lumbar region: Secondary | ICD-10-CM | POA: Diagnosis not present

## 2019-09-14 DIAGNOSIS — M9902 Segmental and somatic dysfunction of thoracic region: Secondary | ICD-10-CM | POA: Diagnosis not present

## 2019-09-14 DIAGNOSIS — M5441 Lumbago with sciatica, right side: Secondary | ICD-10-CM | POA: Diagnosis not present

## 2019-09-14 DIAGNOSIS — M546 Pain in thoracic spine: Secondary | ICD-10-CM | POA: Diagnosis not present

## 2019-09-16 ENCOUNTER — Other Ambulatory Visit: Payer: Self-pay | Admitting: Podiatry

## 2019-09-16 DIAGNOSIS — M79671 Pain in right foot: Secondary | ICD-10-CM

## 2019-09-19 ENCOUNTER — Ambulatory Visit (INDEPENDENT_AMBULATORY_CARE_PROVIDER_SITE_OTHER): Payer: Medicare Other | Admitting: Podiatry

## 2019-09-19 ENCOUNTER — Other Ambulatory Visit: Payer: Self-pay

## 2019-09-19 ENCOUNTER — Ambulatory Visit (INDEPENDENT_AMBULATORY_CARE_PROVIDER_SITE_OTHER): Payer: Medicare Other

## 2019-09-19 DIAGNOSIS — M7742 Metatarsalgia, left foot: Secondary | ICD-10-CM

## 2019-09-19 DIAGNOSIS — M79671 Pain in right foot: Secondary | ICD-10-CM

## 2019-09-19 DIAGNOSIS — M7741 Metatarsalgia, right foot: Secondary | ICD-10-CM | POA: Diagnosis not present

## 2019-09-19 DIAGNOSIS — M722 Plantar fascial fibromatosis: Secondary | ICD-10-CM

## 2019-09-19 NOTE — Patient Instructions (Signed)

## 2019-09-21 ENCOUNTER — Other Ambulatory Visit: Payer: Self-pay | Admitting: Podiatry

## 2019-09-21 DIAGNOSIS — M722 Plantar fascial fibromatosis: Secondary | ICD-10-CM

## 2019-09-23 ENCOUNTER — Other Ambulatory Visit: Payer: Self-pay | Admitting: Urology

## 2019-09-26 ENCOUNTER — Other Ambulatory Visit: Payer: Self-pay | Admitting: Urology

## 2019-10-12 DIAGNOSIS — M9902 Segmental and somatic dysfunction of thoracic region: Secondary | ICD-10-CM | POA: Diagnosis not present

## 2019-10-12 DIAGNOSIS — M5441 Lumbago with sciatica, right side: Secondary | ICD-10-CM | POA: Diagnosis not present

## 2019-10-12 DIAGNOSIS — M5136 Other intervertebral disc degeneration, lumbar region: Secondary | ICD-10-CM | POA: Diagnosis not present

## 2019-10-12 DIAGNOSIS — M9904 Segmental and somatic dysfunction of sacral region: Secondary | ICD-10-CM | POA: Diagnosis not present

## 2019-10-12 DIAGNOSIS — M546 Pain in thoracic spine: Secondary | ICD-10-CM | POA: Diagnosis not present

## 2019-10-12 DIAGNOSIS — M9903 Segmental and somatic dysfunction of lumbar region: Secondary | ICD-10-CM | POA: Diagnosis not present

## 2019-10-12 DIAGNOSIS — M5137 Other intervertebral disc degeneration, lumbosacral region: Secondary | ICD-10-CM | POA: Diagnosis not present

## 2019-10-17 ENCOUNTER — Other Ambulatory Visit: Payer: Self-pay

## 2019-10-17 ENCOUNTER — Ambulatory Visit (INDEPENDENT_AMBULATORY_CARE_PROVIDER_SITE_OTHER): Payer: Medicare Other | Admitting: Podiatry

## 2019-10-17 DIAGNOSIS — M722 Plantar fascial fibromatosis: Secondary | ICD-10-CM | POA: Diagnosis not present

## 2019-10-17 DIAGNOSIS — M79671 Pain in right foot: Secondary | ICD-10-CM | POA: Diagnosis not present
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
## 2019-11-08 DIAGNOSIS — M9902 Segmental and somatic dysfunction of thoracic region: Secondary | ICD-10-CM | POA: Diagnosis not present

## 2019-11-08 DIAGNOSIS — M5136 Other intervertebral disc degeneration, lumbar region: Secondary | ICD-10-CM | POA: Diagnosis not present

## 2019-11-08 DIAGNOSIS — M5137 Other intervertebral disc degeneration, lumbosacral region: Secondary | ICD-10-CM | POA: Diagnosis not present

## 2019-11-08 DIAGNOSIS — M5441 Lumbago with sciatica, right side: Secondary | ICD-10-CM | POA: Diagnosis not present

## 2019-11-08 DIAGNOSIS — M9904 Segmental and somatic dysfunction of sacral region: Secondary | ICD-10-CM | POA: Diagnosis not present

## 2019-11-08 DIAGNOSIS — M546 Pain in thoracic spine: Secondary | ICD-10-CM | POA: Diagnosis not present

## 2019-11-08 DIAGNOSIS — M9903 Segmental and somatic dysfunction of lumbar region: Secondary | ICD-10-CM | POA: Diagnosis not present

## 2019-11-14 NOTE — Patient Instructions (Addendum)
DUE TO COVID-19 ONLY ONE VISITOR IS ALLOWED TO COME WITH YOU AND STAY IN THE WAITING ROOM ONLY DURING PRE OP AND PROCEDURE DAY OF SURGERY. THE 1 VISITOR MAY VISIT WITH YOU AFTER SURGERY IN YOUR PRIVATE ROOM DURING VISITING HOURS ONLY!  YOUR COVID TEST IS COMPLETED, PLEASE BEGIN THE QUARANTINE INSTRUCTIONS AS OUTLINED IN YOUR HANDOUT.                Joanne Greene    Your procedure is scheduled on: 12-4   Report to [place]  Entrance   Report to admitting at 10:30AM     Call this number if you have problems the morning of surgery [phone]     Remember: Do not eat food or drink liquids :After Midnight. BRUSH YOUR TEETH MORNING OF SURGERY AND RINSE YOUR MOUTH OUT, NO CHEWING GUM CANDY OR MINTS.     Take these medicines the morning of surgery with A SIP OF WATER: NONE                                  You may not have any metal on your body including hair pins and              piercings  Do not wear jewelry, make-up, lotions, powders or perfumes, deodorant             Do not wear nail polish on your fingernails.  Do not shave  48 hours prior to surgery.                 Do not bring valuables to the hospital. [place].  Contacts, dentures or bridgework may not be worn into surgery.  YOU MAY BRING A SMALL OVERNIGHT BAG                   Please read over the following fact sheets you were given: _____________________________________________________________________             Joanne Greene - Preparing for Surgery Before surgery, you can play an important role.  Because skin is not sterile, your skin needs to be as free of germs as possible.  You can reduce the number of germs on your skin by washing with CHG (chlorahexidine gluconate) soap before surgery.  CHG is an antiseptic cleaner which kills germs and bonds with the skin to continue killing germs even after washing. Please DO NOT use if you have an allergy  to CHG or antibacterial soaps.  If your skin becomes reddened/irritated stop using the CHG and inform your nurse when you arrive at Short Stay. Do not shave (including legs and underarms) for at least 48 hours prior to the first CHG shower.  You may shave your face/neck. Please follow these instructions carefully:  1.  Shower with CHG Soap the night before surgery and the  morning of Surgery.  2.  If you choose to wash your hair, wash your hair first as usual with your  normal  shampoo.  3.  After you shampoo, rinse your hair and body thoroughly to remove the  shampoo.                           4.  Use CHG as you would any other liquid  soap.  You can apply chg directly  to the skin and wash                       Gently with a scrungie or clean washcloth.  5.  Apply the CHG Soap to your body ONLY FROM THE NECK DOWN.   Do not use on face/ open                           Wound or open sores. Avoid contact with eyes, ears mouth and genitals (private parts).                       Wash face,  Genitals (private parts) with your normal soap.             6.  Wash thoroughly, paying special attention to the area where your surgery  will be performed.  7.  Thoroughly rinse your body with warm water from the neck down.  8.  DO NOT shower/wash with your normal soap after using and rinsing off  the CHG Soap.                9.  Pat yourself dry with a clean towel.            10.  Wear clean pajamas.            11.  Place clean sheets on your bed the night of your first shower and do not  sleep with pets. Day of Surgery : Do not apply any lotions/deodorants the morning of surgery.  Please wear clean clothes to the hospital/surgery Greene.  FAILURE TO FOLLOW THESE INSTRUCTIONS MAY RESULT IN THE CANCELLATION OF YOUR SURGERY PATIENT SIGNATURE_________________________________  NURSE SIGNATURE__________________________________  ________________________________________________________________________   Joanne Greene  An incentive spirometer is a tool that can help keep your lungs clear and active. This tool measures how well you are filling your lungs with each breath. Taking long deep breaths may help reverse or decrease the chance of developing breathing (pulmonary) problems (especially infection) following:  A long period of time when you are unable to move or be active. BEFORE THE PROCEDURE   If the spirometer includes an indicator to show your best effort, your nurse or respiratory therapist will set it to a desired goal.  If possible, sit up straight or lean slightly forward. Try not to slouch.  Hold the incentive spirometer in an upright position. INSTRUCTIONS FOR USE  1. Sit on the edge of your bed if possible, or sit up as far as you can in bed or on a chair. 2. Hold the incentive spirometer in an upright position. 3. Breathe out normally. 4. Place the mouthpiece in your mouth and seal your lips tightly around it. 5. Breathe in slowly and as deeply as possible, raising the piston or the ball toward the top of the column. 6. Hold your breath for 3-5 seconds or for as long as possible. Allow the piston or ball to fall to the bottom of the column. 7. Remove the mouthpiece from your mouth and breathe out normally. 8. Rest for a few seconds and repeat Steps 1 through 7 at least 10 times every 1-2 hours when you are awake. Take your time and take a few normal breaths between deep breaths. 9. The spirometer may include an indicator to show your best effort. Use the indicator as a goal to  work toward during each repetition. 10. After each set of 10 deep breaths, practice coughing to be sure your lungs are clear. If you have an incision (the cut made at the time of surgery), support your incision when coughing by placing a pillow or rolled up towels firmly against it. Once you are able to get out of bed, walk around indoors and cough well. You may stop using the incentive spirometer when  instructed by your caregiver.  RISKS AND COMPLICATIONS  Take your time so you do not get dizzy or light-headed.  If you are in pain, you may need to take or ask for pain medication before doing incentive spirometry. It is harder to take a deep breath if you are having pain. AFTER USE  Rest and breathe slowly and easily.  It can be helpful to keep track of a log of your progress. Your caregiver can provide you with a simple table to help with this. If you are using the spirometer at home, follow these instructions: [place] IF:   You are having difficultly using the spirometer.  You have trouble using the spirometer as often as instructed.  Your pain medication is not giving enough relief while using the spirometer.  You develop fever of 100.5 F (38.1 C) or higher. SEEK IMMEDIATE MEDICAL CARE IF:   You cough up bloody sputum that had not been present before.  You develop fever of 102 F (38.9 C) or greater.  You develop worsening pain at or near the incision site. MAKE SURE YOU:   Understand these instructions.  Will watch your condition.  Will get help right away if you are not doing well or get worse. Document Released: 04/13/2007 Document Revised: 02/23/2012 Document Reviewed: 06/14/2007 ExitCare Patient Information 2014 Exit[employer], [place].   ________________________________________________________________________  WHAT IS A BLOOD TRANSFUSION? Blood Transfusion Information  A transfusion is the replacement of blood or some of its parts. Blood is made up of multiple cells which provide different functions.  Red blood cells carry oxygen and are used for blood loss replacement.  White blood cells fight against infection.  Platelets control bleeding.  Plasma helps clot blood.  Other blood products are available for specialized needs, such as hemophilia or other clotting disorders. BEFORE THE TRANSFUSION  Who gives blood for transfusions?   Healthy  volunteers who are fully evaluated to make sure their blood is safe. This is blood bank blood. Transfusion therapy is the safest it has ever been in the practice of medicine. Before blood is taken from a donor, a complete history is taken to make sure that person has no history of diseases nor engages in risky social behavior (examples are intravenous drug use or sexual activity with multiple partners). The donor's travel history is screened to minimize risk of transmitting infections, such as malaria. The donated blood is tested for signs of infectious diseases, such as HIV and hepatitis. The blood is then tested to be sure it is compatible with you in order to minimize the chance of a transfusion reaction. If you or a relative donates blood, this is often done in anticipation of surgery and is not appropriate for emergency situations. It takes many days to process the donated blood. RISKS AND COMPLICATIONS Although transfusion therapy is very safe and saves many lives, the main dangers of transfusion include:   Getting an infectious disease.  Developing a transfusion reaction. This is an allergic reaction to something in the blood you were given. Every precaution is  taken to prevent this. The decision to have a blood transfusion has been considered carefully by your caregiver before blood is given. Blood is not given unless the benefits outweigh the risks. AFTER THE TRANSFUSION  Right after receiving a blood transfusion, you will usually feel much better and more energetic. This is especially true if your red blood cells have gotten low (anemic). The transfusion raises the level of the red blood cells which carry oxygen, and this usually causes an energy increase.  The nurse administering the transfusion will monitor you carefully for complications. HOME CARE INSTRUCTIONS  No special instructions are needed after a transfusion. You may find your energy is better. Speak with your caregiver about any  limitations on activity for underlying diseases you may have. SEEK MEDICAL CARE IF:   Your condition is not improving after your transfusion.  You develop redness or irritation at the intravenous (IV) site. SEEK IMMEDIATE MEDICAL CARE IF:  Any of the following symptoms occur over the next 12 hours:  Shaking chills.  You have a temperature by mouth above 102 F (38.9 C), not controlled by medicine.  Chest, back, or muscle pain.  People around you feel you are not acting correctly or are confused.  Shortness of breath or difficulty breathing.  Dizziness and fainting.  You get a rash or develop hives.  You have a decrease in urine output.  Your urine turns a dark color or changes to pink, red, or brown. Any of the following symptoms occur over the next 10 days:  You have a temperature by mouth above 102 F (38.9 C), not controlled by medicine.  Shortness of breath.  Weakness after normal activity.  The white part of the eye turns yellow (jaundice).  You have a decrease in the amount of urine or are urinating less often.  Your urine turns a dark color or changes to pink, red, or brown. Document Released: 11/28/2000 Document Revised: 02/23/2012 Document Reviewed: 07/17/2008 [facility] Patient Information 2014 [place], [place].  _______________________________________________________________________

## 2019-11-15 ENCOUNTER — Other Ambulatory Visit (HOSPITAL_COMMUNITY)
Admission: RE | Admit: 2019-11-15 | Discharge: 2019-11-15 | Disposition: A | Discharge status: Home / Self Care | Payer: Medicare Other | Source: Ambulatory Visit | Attending: Urology | Admitting: Urology

## 2019-11-15 DIAGNOSIS — Z20828 Contact with and (suspected) exposure to other viral communicable diseases: Secondary | ICD-10-CM | POA: Insufficient documentation

## 2019-11-15 DIAGNOSIS — Z01812 Encounter for preprocedural laboratory examination: Secondary | ICD-10-CM | POA: Insufficient documentation

## 2019-11-17 ENCOUNTER — Encounter (HOSPITAL_COMMUNITY): Payer: Self-pay

## 2019-11-17 ENCOUNTER — Other Ambulatory Visit: Payer: Self-pay

## 2019-11-17 ENCOUNTER — Encounter (HOSPITAL_COMMUNITY)
Admission: RE | Admit: 2019-11-17 | Discharge: 2019-11-17 | Disposition: A | Discharge status: Home / Self Care | Payer: Medicare Other | Source: Ambulatory Visit | Attending: Urology | Admitting: Urology

## 2019-11-17 DIAGNOSIS — Z01812 Encounter for preprocedural laboratory examination: Secondary | ICD-10-CM | POA: Insufficient documentation

## 2019-11-17 DIAGNOSIS — N135 Crossing vessel and stricture of ureter without hydronephrosis: Secondary | ICD-10-CM | POA: Insufficient documentation

## 2019-11-17 DIAGNOSIS — N261 Atrophy of kidney (terminal): Secondary | ICD-10-CM | POA: Insufficient documentation

## 2019-11-17 HISTORY — DX: Rosacea, unspecified: L71.9

## 2019-11-17 HISTORY — DX: Chronic kidney disease, unspecified: N18.9

## 2019-11-17 HISTORY — DX: Tremor, unspecified: R25.1

## 2019-11-17 LAB — COMPREHENSIVE METABOLIC PANEL
ALT: 16 U/L (ref 0–44)
AST: 20 U/L (ref 15–41)
Albumin: 3.9 g/dL (ref 3.5–5.0)
Alkaline Phosphatase: 68 U/L (ref 38–126)
Anion gap: 8 (ref 5–15)
BUN: 22 mg/dL (ref 8–23)
CO2: 27 mmol/L (ref 22–32)
Calcium: 9.4 mg/dL (ref 8.9–10.3)
Chloride: 106 mmol/L (ref 98–111)
Creatinine, Ser: 1.08 mg/dL — ABNORMAL HIGH (ref 0.44–1.00)
GFR calc Af Amer: 59 mL/min — ABNORMAL LOW (ref >60)
GFR calc non Af Amer: 51 mL/min — ABNORMAL LOW (ref >60)
Glucose, Bld: 89 mg/dL (ref 70–99)
Potassium: 4.5 mmol/L (ref 3.5–5.1)
Sodium: 141 mmol/L (ref 135–145)
Total Bilirubin: 0.7 mg/dL (ref 0.3–1.2)
Total Protein: 6.8 g/dL (ref 6.5–8.1)

## 2019-11-17 LAB — CBC
HCT: 40.9 % (ref 36.0–46.0)
Hemoglobin: 13.1 g/dL (ref 12.0–15.0)
MCH: 31.4 pg (ref 26.0–34.0)
MCHC: 32 g/dL (ref 30.0–36.0)
MCV: 98.1 fL (ref 80.0–100.0)
Platelets: 163 10*3/uL (ref 150–400)
RBC: 4.17 MIL/uL (ref 3.87–5.11)
RDW: 12.5 % (ref 11.5–15.5)
WBC: 4.2 10*3/uL (ref 4.0–10.5)
nRBC: 0 % (ref 0.0–0.2)

## 2019-11-17 LAB — PROTIME-INR
INR: 1 (ref 0.8–1.2)
Prothrombin Time: 13.1 seconds (ref 11.4–15.2)

## 2019-11-17 LAB — ABO/RH: ABO/RH(D): A NEG

## 2019-11-17 LAB — NOVEL CORONAVIRUS, NAA (HOSP ORDER, SEND-OUT TO REF LAB; TAT 18-24 HRS): SARS-CoV-2, NAA: NOT DETECTED

## 2019-11-17 NOTE — Progress Notes (Signed)
PCP - Townsend Roger, MD Cardiologist -   Chest x-ray -  EKG -  Stress Test -  ECHO -  Cardiac Cath -   Sleep Study -  CPAP -   Fasting Blood Sugar -  Checks Blood Sugar _____ times a day  Blood Thinner Instructions: Aspirin Instructions: Last Dose:  Anesthesia review:   Patient denies shortness of breath, fever, cough and chest pain at PAT appointment   Patient verbalized understanding of instructions that were given to them at the PAT appointment. Patient was also instructed that they will need to review over the PAT instructions again at home before surgery.

## 2019-11-17 NOTE — Anesthesia Preprocedure Evaluation (Addendum)
Anesthesia Evaluation  Patient identified by MRN, date of birth, ID band Patient awake    Reviewed: Allergy & Precautions, NPO status , Patient's Chart, lab work & pertinent test results  History of Anesthesia Complications Negative for: history of anesthetic complications  Airway Mallampati: III  TM Distance: >3 FB Neck ROM: Full    Dental no notable dental hx. (+) Caps,    Pulmonary neg pulmonary ROS,    Pulmonary exam normal        Cardiovascular negative cardio ROS Normal cardiovascular exam     Neuro/Psych negative neurological ROS  negative psych ROS   GI/Hepatic negative GI ROS,   Endo/Other    Renal/GU Renal InsufficiencyRenal disease  negative genitourinary   Musculoskeletal negative musculoskeletal ROS (+)   Abdominal   Peds  Hematology negative hematology ROS (+)   Anesthesia Other Findings   Reproductive/Obstetrics negative OB ROS                            Anesthesia Physical Anesthesia Plan  ASA: II  Anesthesia Plan: General   Post-op Pain Management:    Induction: Intravenous  PONV Risk Score and Plan: 4 or greater and Treatment may vary due to age or medical condition, Ondansetron, Dexamethasone and Midazolam  Airway Management Planned: Oral ETT  Additional Equipment: None  Intra-op Plan:   Post-operative Plan: Extubation in OR  Informed Consent: I have reviewed the patients History and Physical, chart, labs and discussed the procedure including the risks, benefits and alternatives for the proposed anesthesia with the patient or authorized representative who has indicated his/her understanding and acceptance.     Dental advisory given  Plan Discussed with: CRNA  Anesthesia Plan Comments:        Anesthesia Quick Evaluation

## 2019-11-18 ENCOUNTER — Encounter (HOSPITAL_COMMUNITY)
Admission: RE | Disposition: A | Discharge status: Home / Self Care | Payer: Self-pay | Source: Home / Self Care | Attending: Urology

## 2019-11-18 ENCOUNTER — Encounter (HOSPITAL_COMMUNITY): Payer: Self-pay

## 2019-11-18 ENCOUNTER — Ambulatory Visit (HOSPITAL_COMMUNITY): Payer: Medicare Other | Admitting: Physician Assistant

## 2019-11-18 ENCOUNTER — Ambulatory Visit (HOSPITAL_COMMUNITY): Payer: Medicare Other | Admitting: Anesthesiology

## 2019-11-18 ENCOUNTER — Other Ambulatory Visit: Payer: Self-pay

## 2019-11-18 ENCOUNTER — Inpatient Hospital Stay (HOSPITAL_COMMUNITY)
Admission: RE | Admit: 2019-11-18 | Discharge: 2019-11-21 | DRG: 660 | Disposition: A | Discharge status: Home / Self Care | Payer: Medicare Other | Attending: Internal Medicine | Admitting: Internal Medicine

## 2019-11-18 DIAGNOSIS — N261 Atrophy of kidney (terminal): Secondary | ICD-10-CM | POA: Diagnosis not present

## 2019-11-18 DIAGNOSIS — M545 Low back pain: Secondary | ICD-10-CM | POA: Diagnosis not present

## 2019-11-18 DIAGNOSIS — N2889 Other specified disorders of kidney and ureter: Secondary | ICD-10-CM | POA: Diagnosis present

## 2019-11-18 DIAGNOSIS — Z20828 Contact with and (suspected) exposure to other viral communicable diseases: Secondary | ICD-10-CM | POA: Diagnosis not present

## 2019-11-18 DIAGNOSIS — R55 Syncope and collapse: Secondary | ICD-10-CM | POA: Diagnosis not present

## 2019-11-18 DIAGNOSIS — Z888 Allergy status to other drugs, medicaments and biological substances status: Secondary | ICD-10-CM | POA: Diagnosis not present

## 2019-11-18 DIAGNOSIS — M810 Age-related osteoporosis without current pathological fracture: Secondary | ICD-10-CM | POA: Diagnosis present

## 2019-11-18 DIAGNOSIS — Q6211 Congenital occlusion of ureteropelvic junction: Secondary | ICD-10-CM | POA: Diagnosis not present

## 2019-11-18 DIAGNOSIS — N1330 Unspecified hydronephrosis: Secondary | ICD-10-CM | POA: Diagnosis not present

## 2019-11-18 DIAGNOSIS — Z9102 Food additives allergy status: Secondary | ICD-10-CM

## 2019-11-18 DIAGNOSIS — Q6239 Other obstructive defects of renal pelvis and ureter: Secondary | ICD-10-CM

## 2019-11-18 HISTORY — DX: Other specified disorders of kidney and ureter: N28.89

## 2019-11-18 HISTORY — PX: ROBOT ASSISTED LAPAROSCOPIC NEPHRECTOMY: SHX5140

## 2019-11-18 LAB — TYPE AND SCREEN
ABO/RH(D): A NEG
Antibody Screen: NEGATIVE

## 2019-11-18 LAB — HEMOGLOBIN AND HEMATOCRIT, BLOOD
HCT: 36.6 % (ref 36.0–46.0)
Hemoglobin: 11.9 g/dL — ABNORMAL LOW (ref 12.0–15.0)

## 2019-11-18 SURGERY — NEPHRECTOMY, RADICAL, ROBOT-ASSISTED, LAPAROSCOPIC, ADULT
Anesthesia: General | Laterality: Left

## 2019-11-18 MED ORDER — HYDROCODONE-ACETAMINOPHEN 5-325 MG PO TABS: 1.0000 | ORAL_TABLET | Freq: Four times a day (QID) | ORAL | 0 refills | Status: DC | PRN

## 2019-11-18 MED ORDER — PROMETHAZINE HCL 12.5 MG PO TABS: 12.5000 mg | ORAL_TABLET | ORAL | 0 refills | Status: DC | PRN

## 2019-11-18 MED ORDER — BUPIVACAINE LIPOSOME 1.3 % IJ SUSP
20.0000 mL | Freq: Once | INTRAMUSCULAR | Status: AC
  Administered 2019-11-18: 20 mL
  Filled 2019-11-18: qty 20

## 2019-11-18 MED ORDER — HYDROCODONE-ACETAMINOPHEN 5-325 MG PO TABS
1.0000 | ORAL_TABLET | ORAL | Status: DC | PRN
  Administered 2019-11-19 – 2019-11-21 (×5): 1 via ORAL
  Filled 2019-11-18: qty 1
  Filled 2019-11-18: qty 2
  Filled 2019-11-18 (×4): qty 1

## 2019-11-18 MED ORDER — LACTATED RINGERS IR SOLN
Status: DC | PRN
  Administered 2019-11-18: 1000 mL

## 2019-11-18 MED ORDER — PROMETHAZINE HCL 25 MG/ML IJ SOLN
INTRAMUSCULAR | Status: AC
  Filled 2019-11-18: qty 1

## 2019-11-18 MED ORDER — ROCURONIUM BROMIDE 10 MG/ML (PF) SYRINGE
PREFILLED_SYRINGE | INTRAVENOUS | Status: DC | PRN
  Administered 2019-11-18: 10 mg via INTRAVENOUS
  Administered 2019-11-18: 50 mg via INTRAVENOUS

## 2019-11-18 MED ORDER — PROPOFOL 10 MG/ML IV BOLUS
INTRAVENOUS | Status: AC
  Filled 2019-11-18: qty 20

## 2019-11-18 MED ORDER — FENTANYL CITRATE (PF) 250 MCG/5ML IJ SOLN
INTRAMUSCULAR | Status: DC | PRN
  Administered 2019-11-18 (×2): 100 ug via INTRAVENOUS
  Administered 2019-11-18: 50 ug via INTRAVENOUS

## 2019-11-18 MED ORDER — HYDROMORPHONE HCL 1 MG/ML IJ SOLN
INTRAMUSCULAR | Status: AC
  Filled 2019-11-18: qty 2

## 2019-11-18 MED ORDER — DIPHENHYDRAMINE HCL 50 MG/ML IJ SOLN: 12.5000 mg | Freq: Four times a day (QID) | INTRAMUSCULAR | Status: DC | PRN

## 2019-11-18 MED ORDER — HYDROMORPHONE HCL 1 MG/ML IJ SOLN: 0.5000 mg | INTRAMUSCULAR | Status: DC | PRN

## 2019-11-18 MED ORDER — CEFAZOLIN SODIUM-DEXTROSE 2-4 GM/100ML-% IV SOLN
2.0000 g | Freq: Once | INTRAVENOUS | Status: AC
  Administered 2019-11-18: 2 g via INTRAVENOUS
  Filled 2019-11-18: qty 100

## 2019-11-18 MED ORDER — MIDAZOLAM HCL 2 MG/2ML IJ SOLN
INTRAMUSCULAR | Status: DC | PRN
  Administered 2019-11-18: 2 mg via INTRAVENOUS

## 2019-11-18 MED ORDER — STERILE WATER FOR IRRIGATION IR SOLN
Status: DC | PRN
  Administered 2019-11-18: 1000 mL

## 2019-11-18 MED ORDER — DEXAMETHASONE SODIUM PHOSPHATE 10 MG/ML IJ SOLN
INTRAMUSCULAR | Status: DC | PRN
  Administered 2019-11-18: 10 mg via INTRAVENOUS

## 2019-11-18 MED ORDER — SODIUM CHLORIDE (PF) 0.9 % IJ SOLN
INTRAMUSCULAR | Status: DC | PRN
  Administered 2019-11-18: 20 mL

## 2019-11-18 MED ORDER — OXYCODONE HCL 5 MG/5ML PO SOLN: 5.0000 mg | Freq: Once | ORAL | Status: DC | PRN

## 2019-11-18 MED ORDER — CEFAZOLIN SODIUM-DEXTROSE 1-4 GM/50ML-% IV SOLN
1.0000 g | Freq: Three times a day (TID) | INTRAVENOUS | Status: AC
  Administered 2019-11-18 – 2019-11-19 (×2): 1 g via INTRAVENOUS
  Filled 2019-11-18 (×2): qty 50

## 2019-11-18 MED ORDER — DOCUSATE SODIUM 100 MG PO CAPS
100.0000 mg | ORAL_CAPSULE | Freq: Two times a day (BID) | ORAL | Status: DC
  Administered 2019-11-18 – 2019-11-21 (×6): 100 mg via ORAL
  Filled 2019-11-18 (×6): qty 1

## 2019-11-18 MED ORDER — OXYCODONE HCL 5 MG PO TABS: 5.0000 mg | ORAL_TABLET | Freq: Once | ORAL | Status: DC | PRN

## 2019-11-18 MED ORDER — ACETAMINOPHEN 500 MG PO TABS
1000.0000 mg | ORAL_TABLET | Freq: Once | ORAL | Status: AC
  Administered 2019-11-18: 1000 mg via ORAL
  Filled 2019-11-18: qty 2

## 2019-11-18 MED ORDER — DOCUSATE SODIUM 100 MG PO CAPS: 100.0000 mg | ORAL_CAPSULE | Freq: Two times a day (BID) | ORAL | Status: DC

## 2019-11-18 MED ORDER — ACETAMINOPHEN 10 MG/ML IV SOLN
1000.0000 mg | Freq: Four times a day (QID) | INTRAVENOUS | Status: AC
  Administered 2019-11-18 – 2019-11-19 (×4): 1000 mg via INTRAVENOUS
  Filled 2019-11-18 (×4): qty 100

## 2019-11-18 MED ORDER — HYDROMORPHONE HCL 1 MG/ML IJ SOLN
0.2500 mg | INTRAMUSCULAR | Status: DC | PRN
  Administered 2019-11-18 (×4): 0.5 mg via INTRAVENOUS

## 2019-11-18 MED ORDER — ONDANSETRON HCL 4 MG/2ML IJ SOLN
INTRAMUSCULAR | Status: DC | PRN
  Administered 2019-11-18 (×2): 4 mg via INTRAVENOUS

## 2019-11-18 MED ORDER — PROPOFOL 10 MG/ML IV BOLUS
INTRAVENOUS | Status: DC | PRN
  Administered 2019-11-18: 130 mg via INTRAVENOUS
  Administered 2019-11-18: 40 mg via INTRAVENOUS

## 2019-11-18 MED ORDER — PROMETHAZINE HCL 25 MG/ML IJ SOLN
6.2500 mg | INTRAMUSCULAR | Status: DC | PRN
  Administered 2019-11-18: 6.25 mg via INTRAVENOUS

## 2019-11-18 MED ORDER — FENTANYL CITRATE (PF) 100 MCG/2ML IJ SOLN
INTRAMUSCULAR | Status: AC
  Filled 2019-11-18: qty 2

## 2019-11-18 MED ORDER — SODIUM CHLORIDE (PF) 0.9 % IJ SOLN
INTRAMUSCULAR | Status: AC
  Filled 2019-11-18: qty 20

## 2019-11-18 MED ORDER — FENTANYL CITRATE (PF) 100 MCG/2ML IJ SOLN
INTRAMUSCULAR | Status: DC | PRN
  Administered 2019-11-18 (×2): 50 ug via INTRAVENOUS

## 2019-11-18 MED ORDER — ONDANSETRON HCL 4 MG/2ML IJ SOLN: 4.0000 mg | INTRAMUSCULAR | Status: DC | PRN

## 2019-11-18 MED ORDER — FENTANYL CITRATE (PF) 250 MCG/5ML IJ SOLN
INTRAMUSCULAR | Status: AC
  Filled 2019-11-18: qty 5

## 2019-11-18 MED ORDER — DEXTROSE-NACL 5-0.45 % IV SOLN
INTRAVENOUS | Status: DC
  Administered 2019-11-18 – 2019-11-20 (×4): via INTRAVENOUS

## 2019-11-18 MED ORDER — LABETALOL HCL 5 MG/ML IV SOLN
INTRAVENOUS | Status: DC | PRN
  Administered 2019-11-18 (×2): 2.5 mg via INTRAVENOUS

## 2019-11-18 MED ORDER — LACTATED RINGERS IV SOLN
INTRAVENOUS | Status: DC
  Administered 2019-11-18 (×2): via INTRAVENOUS

## 2019-11-18 MED ORDER — MIDAZOLAM HCL 2 MG/2ML IJ SOLN
INTRAMUSCULAR | Status: AC
  Filled 2019-11-18: qty 2

## 2019-11-18 MED ORDER — BELLADONNA ALKALOIDS-OPIUM 16.2-60 MG RE SUPP: 1.0000 | Freq: Four times a day (QID) | RECTAL | Status: DC | PRN

## 2019-11-18 MED ORDER — LIDOCAINE 2% (20 MG/ML) 5 ML SYRINGE
INTRAMUSCULAR | Status: DC | PRN
  Administered 2019-11-18: 60 mg via INTRAVENOUS
  Administered 2019-11-18: 40 mg via INTRAVENOUS

## 2019-11-18 MED ORDER — DIPHENHYDRAMINE HCL 12.5 MG/5ML PO ELIX: 12.5000 mg | ORAL_SOLUTION | Freq: Four times a day (QID) | ORAL | Status: DC | PRN

## 2019-11-18 MED ORDER — SUGAMMADEX SODIUM 500 MG/5ML IV SOLN
INTRAVENOUS | Status: DC | PRN
  Administered 2019-11-18: 250 mg via INTRAVENOUS

## 2019-11-18 SURGICAL SUPPLY — 54 items
BAG LAPAROSCOPIC 12 15 PORT 16 (BASKET) ×1 IMPLANT
BAG RETRIEVAL 12/15 (BASKET) ×2
BAG RETRIEVAL 12/15MM (BASKET) ×1
CHLORAPREP W/TINT 26 (MISCELLANEOUS) ×3 IMPLANT
CLIP VESOLOCK LG 6/CT PURPLE (CLIP) ×6 IMPLANT
CLIP VESOLOCK MED LG 6/CT (CLIP) ×3 IMPLANT
CLIP VESOLOCK XL 6/CT (CLIP) ×3 IMPLANT
COVER SURGICAL LIGHT HANDLE (MISCELLANEOUS) ×3 IMPLANT
COVER TIP SHEARS 8 DVNC (MISCELLANEOUS) ×1 IMPLANT
COVER TIP SHEARS 8MM DA VINCI (MISCELLANEOUS) ×2
COVER WAND RF STERILE (DRAPES) IMPLANT
CUTTER ECHEON FLEX ENDO 45 340 (ENDOMECHANICALS) ×3 IMPLANT
DECANTER SPIKE VIAL GLASS SM (MISCELLANEOUS) ×3 IMPLANT
DERMABOND ADVANCED (GAUZE/BANDAGES/DRESSINGS) ×2
DERMABOND ADVANCED .7 DNX12 (GAUZE/BANDAGES/DRESSINGS) ×1 IMPLANT
DRAPE ARM DVNC X/XI (DISPOSABLE) ×4 IMPLANT
DRAPE COLUMN DVNC XI (DISPOSABLE) ×1 IMPLANT
DRAPE DA VINCI XI ARM (DISPOSABLE) ×8
DRAPE DA VINCI XI COLUMN (DISPOSABLE) ×2
DRAPE INCISE IOBAN 66X45 STRL (DRAPES) ×3 IMPLANT
DRAPE SHEET LG 3/4 BI-LAMINATE (DRAPES) ×3 IMPLANT
ELECT REM PT RETURN 15FT ADLT (MISCELLANEOUS) ×3 IMPLANT
GLOVE BIO SURGEON STRL SZ 6.5 (GLOVE) ×2 IMPLANT
GLOVE BIO SURGEONS STRL SZ 6.5 (GLOVE) ×1
GLOVE BIOGEL M STRL SZ7.5 (GLOVE) ×6 IMPLANT
GOWN STRL REUS W/TWL LRG LVL3 (GOWN DISPOSABLE) ×3 IMPLANT
GOWN STRL REUS W/TWL XL LVL3 (GOWN DISPOSABLE) ×6 IMPLANT
HEMOSTAT SURGICEL 4X8 (HEMOSTASIS) ×3 IMPLANT
IRRIG SUCT STRYKERFLOW 2 WTIP (MISCELLANEOUS) ×3
IRRIGATION SUCT STRKRFLW 2 WTP (MISCELLANEOUS) ×1 IMPLANT
KIT BASIN OR (CUSTOM PROCEDURE TRAY) ×3 IMPLANT
KIT TURNOVER KIT A (KITS) IMPLANT
NEEDLE INSUFFLATION 14GA 120MM (NEEDLE) ×3 IMPLANT
NS IRRIG 1000ML POUR BTL (IV SOLUTION) ×3 IMPLANT
PENCIL SMOKE EVACUATOR (MISCELLANEOUS) IMPLANT
PROTECTOR NERVE ULNAR (MISCELLANEOUS) ×6 IMPLANT
SEAL CANN UNIV 5-8 DVNC XI (MISCELLANEOUS) ×4 IMPLANT
SEAL XI 5MM-8MM UNIVERSAL (MISCELLANEOUS) ×8
SET TUBE SMOKE EVAC HIGH FLOW (TUBING) ×3 IMPLANT
SOLUTION ELECTROLUBE (MISCELLANEOUS) ×3 IMPLANT
STAPLE RELOAD 45 WHT (STAPLE) ×2 IMPLANT
STAPLE RELOAD 45MM WHITE (STAPLE) ×4
SUT MNCRL AB 4-0 PS2 18 (SUTURE) ×6 IMPLANT
SUT PDS AB 0 CT1 36 (SUTURE) ×6 IMPLANT
SUT VIC AB 2-0 SH 27 (SUTURE) ×2
SUT VIC AB 2-0 SH 27X BRD (SUTURE) ×1 IMPLANT
SUT VICRYL 0 UR6 27IN ABS (SUTURE) ×3 IMPLANT
TOWEL OR 17X26 10 PK STRL BLUE (TOWEL DISPOSABLE) ×3 IMPLANT
TOWEL OR NON WOVEN STRL DISP B (DISPOSABLE) ×3 IMPLANT
TRAY FOLEY MTR SLVR 14FR STAT (SET/KITS/TRAYS/PACK) ×3 IMPLANT
TRAY LAPAROSCOPIC (CUSTOM PROCEDURE TRAY) ×3 IMPLANT
TROCAR BLADELESS OPT 5 100 (ENDOMECHANICALS) ×3 IMPLANT
TROCAR XCEL 12X100 BLDLESS (ENDOMECHANICALS) ×3 IMPLANT
WATER STERILE IRR 1000ML POUR (IV SOLUTION) ×3 IMPLANT

## 2019-11-18 NOTE — Anesthesia Postprocedure Evaluation (Signed)
Anesthesia Post Note  Patient: Joanne Greene  Procedure(s) Performed: XI ROBOTIC ASSISTED LAPAROSCOPIC SIMPLE NEPHRECTOMY (Left )     Patient location during evaluation: PACU Anesthesia Type: General Level of consciousness: awake and alert and oriented Pain management: pain level controlled Vital Signs Assessment: post-procedure vital signs reviewed and stable Respiratory status: spontaneous breathing, nonlabored ventilation and respiratory function stable Cardiovascular status: blood pressure returned to baseline Postop Assessment: no apparent nausea or vomiting Anesthetic complications: no    Last Vitals:  Vitals:   11/18/19 1630 11/18/19 1649  BP: (!) 163/91 (!) 177/92  Pulse: (!) 59 68  Resp: 14 14  Temp: (!) 36.4 C (!) 36.3 C  SpO2: 100% 100%    Last Pain:  Vitals:   11/18/19 1649  TempSrc:   PainSc: [place]

## 2019-11-18 NOTE — Discharge Instructions (Signed)
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
## 2019-11-18 NOTE — Transfer of Care (Signed)
Immediate Anesthesia Transfer of Care Note  Patient: Joanne Greene  Procedure(s) Performed: XI ROBOTIC ASSISTED LAPAROSCOPIC SIMPLE NEPHRECTOMY (Left )  Patient Location: PACU  Anesthesia Type:General  Level of Consciousness: awake and alert   Airway & Oxygen Therapy: Patient Spontanous Breathing and Patient connected to face mask oxygen  Post-op Assessment: Report given to RN and Post -op Vital signs reviewed and stable  Post vital signs: Reviewed and stable  Last Vitals:  Vitals Value Taken Time  BP 170/101 11/18/19 1526  Temp    Pulse 65 11/18/19 1528  Resp 11 11/18/19 1528  SpO2 100 % 11/18/19 1528  Vitals shown include unvalidated device data.  Last Pain:  Vitals:   11/18/19 1030  TempSrc: Oral         Complications: No apparent anesthesia complications

## 2019-11-18 NOTE — Anesthesia Procedure Notes (Signed)
Procedure Name: Intubation Date/Time: 11/18/2019 1:00 PM Performed by: Cynda Familia, CRNA Pre-anesthesia Checklist: Patient identified, Emergency Drugs available, Suction available and Patient being monitored Patient Re-evaluated:Patient Re-evaluated prior to induction Oxygen Delivery Method: Circle System Utilized Preoxygenation: Pre-oxygenation with 100% oxygen Induction Type: IV induction Ventilation: Mask ventilation without difficulty Laryngoscope Size: Miller and 2 Grade View: Grade I Tube type: Oral Number of attempts: 1 Airway Equipment and Method: Stylet Placement Confirmation: ETT inserted through vocal cords under direct vision,  positive ETCO2 and breath sounds checked- equal and bilateral Secured at: 21 cm Tube secured with: Tape Dental Injury: Teeth and Oropharynx as per pre-operative assessment  Comments: IV induction Howze-- intubation atraumatic -AM CRNA-- pt with chipped left front tooth prior to laryngoscopy with irregular surfaces to other front teeth-- teeth and mouth unchanged with intubation-- bilat BS Howze

## 2019-11-18 NOTE — Anesthesia Procedure Notes (Signed)
Date/Time: 11/18/2019 3:18 PM Performed by: Cynda Familia, CRNA Oxygen Delivery Method: Simple face mask Placement Confirmation: positive ETCO2 and breath sounds checked- equal and bilateral Dental Injury: Teeth and Oropharynx as per pre-operative assessment

## 2019-11-18 NOTE — Op Note (Addendum)
Operative Note  Preoperative diagnosis:  1.  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  Robot-assisted laparoscopic left simple nephrectomy  Surgeon: Ellison Hughs, MD  Assistants:  Debbrah Alar, [facility]  An assistant was required for this surgical procedure.  The duties of the assistant included but were not limited to suctioning, passing suture, camera manipulation, retraction.  This procedure would not be able to be performed without an Environmental consultant.   Anesthesia:  General  Complications:  None  EBL: 50 mL  Specimens: 1.  Left kidney  Drains/Catheters: 1.  Foley catheter  Intraoperative findings:   1. Severely hydronephrotic left kidney  Indication:  Joanne Greene is a 73 y.o. female with an atrophic and hypofunctioning left kidney due to a congenital left UPJ obstruction with concomitant left-sided flank pain.  The patient has been consented for the above procedures, voices understanding wishes to proceed.  Description of procedure:  After informed consent was obtained, the patient was brought to the operating room and general endotracheal anesthesia was administered.  The patient was then placed in the right lateral decubitus position and prepped and draped in usual sterile fashion.  A timeout was performed.  An 8 mm incision was then made lateral to the left rectus muscle at the level of the left 12th rib.  Abdominal access was obtained via a Veress needle.  The abdominal cavity was then insufflated up to 15 mmHg.  An 8 mm port was then introduced into the abdominal cavity.  Inspection of the port entry site by the robotic camera revealed no adjacent organ injury.  We then placed 2 additional 8 mm robotic ports to triangulate the left renal hilum.  A 12 mm assistant port was then placed between the carmera port and  3rd robotic arm.  The white line of Toldt along the descending colon was incised sharply and the colon, along with its mesocolonic fat, was reflected medially until the aorta was identified.  We then made a small window adjacent to the lower pole of the left kidney, identifying the left psoas muscle, left ureter and left gonadal vein.  The left ureter and gonadal vein were then reflected anteriorly allowing [place] to then incised the perihilar attachments using electrocautery.  We encountered a small lumbar vein adjacent to the insertion of the left gonadal vein into the left renal vein.  This lumbar vein was ligated with hemo-lock clips in 2 places and incised sharply.  This provided [place] excellent exposure to the left renal hilum.    A 45 mm endovascular stapler was then used to ligate the left renal artery and then the left renal vein, achieving excellent hemostasis.  The remaining peri-renal attachments were then excised using a combination of blunt dissection and electrocautery.  The left adrenal gland was spared.  The endovascular stapler was then used to ligate the left gonadal vein and left ureter.  Once the kidney was freely mobile, it was placed in Endo Catch bag to be be retrieved at the conclusion of the case.  The robot was then de-docked.  A left lower quadrant Gibson incision was then made and the mass was removed within the Endo Catch bag.  The fascia within the midline assistant port was then closed using an interrupted 0 Vicryl suture.  The fascia of the internal and external oblique was then closed using a 0 PDS suture  in a running fashion.  The subcutaneous tissue within the [facility] incision was then closed using a running 0 Vicryl suture.  All skin incisions were then closed using 4-0 Monocryl and then dressed with Dermabond.  The patient tolerated the procedure well and was transferred to the postanesthesia in stable condition.     Plan: Monitor on the patient over floor overnight

## 2019-11-19 ENCOUNTER — Encounter (HOSPITAL_COMMUNITY): Payer: Self-pay | Admitting: Urology

## 2019-11-19 LAB — BASIC METABOLIC PANEL
Anion gap: 10 (ref 5–15)
BUN: 17 mg/dL (ref 8–23)
CO2: 25 mmol/L (ref 22–32)
Calcium: 8.2 mg/dL — ABNORMAL LOW (ref 8.9–10.3)
Chloride: 100 mmol/L (ref 98–111)
Creatinine, Ser: 1.22 mg/dL — ABNORMAL HIGH (ref 0.44–1.00)
GFR calc Af Amer: 51 mL/min — ABNORMAL LOW (ref >60)
GFR calc non Af Amer: 44 mL/min — ABNORMAL LOW (ref >60)
Glucose, Bld: 186 mg/dL — ABNORMAL HIGH (ref 70–99)
Potassium: 4.4 mmol/L (ref 3.5–5.1)
Sodium: 135 mmol/L (ref 135–145)

## 2019-11-19 LAB — HEMOGLOBIN AND HEMATOCRIT, BLOOD
HCT: 34.3 % — ABNORMAL LOW (ref 36.0–46.0)
Hemoglobin: 11 g/dL — ABNORMAL LOW (ref 12.0–15.0)

## 2019-11-19 LAB — GLUCOSE, CAPILLARY: Glucose-Capillary: 151 mg/dL — ABNORMAL HIGH (ref 70–99)

## 2019-11-19 MED ORDER — CHLORHEXIDINE GLUCONATE CLOTH 2 % EX PADS
6.0000 | MEDICATED_PAD | Freq: Every day | CUTANEOUS | Status: DC
  Administered 2019-11-19: 6 via TOPICAL

## 2019-11-19 NOTE — Progress Notes (Signed)
Urology Inpatient Progress Report  LEFT URETEROPELVIC JUNCTION OBSTRUCTION LEFT RENAL ATROPHY  Procedure(s): XI ROBOTIC ASSISTED LAPAROSCOPIC SIMPLE NEPHRECTOMY  1 Day Post-Op   Intv/Subj: Patient was ambulating this AM and shortly after had syncopal episode while sitting in chair.  Blood glucose, ECG and vitals normal.  Patient denies significant pain.    Active Problems:   Renal mass  Current Facility-Administered Medications  Medication Dose Route Frequency Provider Last Rate Last Dose  . acetaminophen (OFIRMEV) IV 1,000 mg  1,000 mg Intravenous Q6H Dancy, Estill Bamberg, PA-C 400 mL/hr at 11/19/19 0641 1,000 mg at 11/19/19 0641  . Chlorhexidine Gluconate Cloth 2 % PADS 6 each  6 each Topical Daily Winter, Christopher Aaron, MD      . dextrose 5 %-0.45 % sodium chloride infusion   Intravenous Continuous Debbrah Alar, PA-C 75 mL/hr at 11/19/19 [id number]    . diphenhydrAMINE (BENADRYL) injection 12.5 mg  12.5 mg Intravenous Q6H PRN Dancy, Amanda, PA-C       Or  . diphenhydrAMINE (BENADRYL) 12.5 MG/5ML elixir 12.5 mg  12.5 mg Oral Q6H PRN Dancy, Amanda, PA-C      . docusate sodium (COLACE) capsule 100 mg  100 mg Oral BID Debbrah Alar, PA-C   100 mg at 11/19/19 0806  . HYDROcodone-acetaminophen (NORCO/VICODIN) 5-325 MG per tablet 1-2 tablet  1-2 tablet Oral Q4H PRN Debbrah Alar, PA-C   1 tablet at 11/19/19 0019  . HYDROmorphone (DILAUDID) injection 0.5-1 mg  0.5-1 mg Intravenous Q2H PRN Dancy, Amanda, PA-C      . lactated ringers infusion   Intravenous Continuous Brennan Bailey, MD 50 mL/hr at 11/18/19 1035    . ondansetron (ZOFRAN) injection 4 mg  4 mg Intravenous Q4H PRN Dancy, Amanda, PA-C      . opium-belladonna (B&O SUPPRETTES) 16.2-60 MG suppository 1 suppository  1 suppository Rectal Q6H PRN Debbrah Alar, PA-C         Objective: Vital: Vitals:   11/18/19 2100 11/19/19 0203 11/19/19 0558 11/19/19 0859  BP:  120/66 121/69 110/67  Pulse:  65 70 (!) 57  Resp:  16 16   Temp:  98.2  F (36.8 C) 98.4 F (36.9 C)   TempSrc:  Oral Oral   SpO2:  98% 99% 96%  Weight: 71.6 kg     Height: 5\' 7"  (1.702 m)      I/Os: I/O last 3 completed shifts: In: 3068.5 [I.V.:2739.9; IV Piggyback:328.6] Out: 825 [Urine:775; Blood:50]  Physical Exam:  General: After syncopal episode, pt laying in bed and responsive to name; alert and oriented Skin: clammy and cool Lungs: Normal respiratory effort, chest expands symmetrically. GI: Incisions are c/d/i, abd soft and appropriately ttp Foley: draining clear yellow urine Ext: lower extremities symmetric  Lab Results: Recent Labs    11/17/19 1119 11/18/19 1538 11/19/19 0455  WBC 4.2  --   --   HGB 13.1 11.9* 11.0*  HCT 40.9 36.6 34.3*   Recent Labs    11/17/19 1119 11/19/19 0455  NA 141 135  K 4.5 4.4  CL 106 100  CO2 27 25  GLUCOSE 89 186*  BUN 22 17  CREATININE 1.08* 1.22*  CALCIUM 9.4 8.2*   Recent Labs    11/17/19 1119  INR 1.0   No results for input(s): LABURIN in the last 72 hours. Results for orders placed or performed during the hospital encounter of 11/15/19  Novel Coronavirus, NAA (Hosp order, Send-out to Ref Lab; TAT 18-24 hrs     Status: None  Collection Time: 11/15/19 10:19 AM   Specimen: Nasopharyngeal Swab; Respiratory  Result Value Ref Range Status   SARS-CoV-2, NAA NOT DETECTED NOT DETECTED Final    Comment: (NOTE) This nucleic acid amplification test was developed and its performance characteristics determined by [employer]. Nucleic acid amplification tests include PCR and TMA. This test has not been FDA cleared or approved. This test has been authorized by FDA under an Emergency Use Authorization (EUA). This test is only authorized for the duration of time the declaration that circumstances exist justifying the authorization of the emergency use of in vitro diagnostic tests for detection of SARS-CoV-2 virus and/or diagnosis of COVID-19 infection under section 564(b)(1) of the  Act, 21 U.S.C. [id number]) (1), unless the authorization is terminated or revoked sooner. When diagnostic testing is negative, the possibility of a false negative result should be considered in the context of a patient's recent exposures and the presence of clinical signs and symptoms consistent with COVID-19. An individual without symptoms of COVID- 19 and who is not shedding SARS-CoV-2 vi rus would expect to have a negative (not detected) result in this assay. Performed At: [facility] [address] [place], [place] [id number] Rush Farmer MD [id number]    [place]  Final    Comment: Performed at [facility], [place] [address], [place], [place] [address]    Studies/Results: No results found.  Assessment: Procedure(s): XI ROBOTIC ASSISTED LAPAROSCOPIC SIMPLE NEPHRECTOMY, 1 Day Post-Op  doing well.  Plan: -curbside medicine consult: recommend orthostatics prior to ambulating, continue IVF while patient not taking in a lot PO -advance diet as tolerated -d/c foley -home meds -pain meds PRN  -SCS at all times while pt is in bed -anticipated d/c tomorrow   Jacalyn Lefevre, MD Urology 11/19/2019, 10:24 AM

## 2019-11-19 NOTE — Progress Notes (Signed)
Received handoff report from day shift RN during 7p report on 11/18/19.

## 2019-11-19 NOTE — Progress Notes (Signed)
RN and patient ambulated in the hall this morning with no issues or complaints.  Patient sitting in chair after walking and called RN to room a few minutes later stating "I feel like im going to pass out".  Patient pale, diaphoretic and becomes unresponsive for approximately 15 seconds.   Rapid called.  Urology MD in the room.  Patient VSS and blood sugar is WNL.  EKG completed.  Hospitalist consult placed.  Will continue to monitor patient closely and get orthostatic vitals prior to ambulating this afternoon.

## 2019-11-19 NOTE — Progress Notes (Signed)
Received report from day shift RN at 7 pm shift change 12/4

## 2019-11-20 MED ORDER — ENOXAPARIN SODIUM 40 MG/0.4ML ~~LOC~~ SOLN
40.0000 mg | SUBCUTANEOUS | Status: DC
  Filled 2019-11-20: qty 0.4

## 2019-11-20 MED ORDER — ENOXAPARIN SODIUM 30 MG/0.3ML ~~LOC~~ SOLN: 30.0000 mg | Freq: Two times a day (BID) | SUBCUTANEOUS | Status: DC

## 2019-11-20 NOTE — Progress Notes (Signed)
2 Days Post-Op Subjective: No overnight event.  +Flatus and ambulation.  Pt reports moderate pain that is worse with movement.  No nausea, vomiting, fever or chills. No further vasovagal episodes.   Objective: Vital signs in last 24 hours: Temp:  [98 F (36.7 C)-98.9 F (37.2 C)] 98.7 F (37.1 C) (12/06 0949) Pulse Rate:  [70-81] 81 (12/06 0949) Resp:  [16-18] 16 (12/06 0949) BP: (129-154)/(69-91) 151/81 (12/06 0949) SpO2:  [96 %-99 %] 99 % (12/06 0949)  Intake/Output from previous day: 12/05 0701 - 12/06 0700 In: 2399.8 [P.O.:680; I.V.:1719.8] Out: 450 [Urine:450] Intake/Output this shift: No intake/output data recorded.  Physical Exam:  General: Alert and oriented CV: RRR Lungs: Clear Abdomen: Soft, appropriately ttp, incision c/d/i Ext: NT, No erythema  Lab Results: Recent Labs    11/17/19 1119 11/18/19 1538 11/19/19 0455  HGB 13.1 11.9* 11.0*  HCT 40.9 36.6 34.3*   BMET Recent Labs    11/17/19 1119 11/19/19 0455  NA 141 135  K 4.5 4.4  CL 106 100  CO2 27 25  GLUCOSE 89 186*  BUN 22 17  CREATININE 1.08* 1.22*  CALCIUM 9.4 8.2*     Studies/Results: No results found.  Assessment/Plan: 73 year old woman postop day 2 status post robotic simple left nephrectomy. Tolerating gen diet and ambulating.  Pain not fully controlled yet.  -continue gen diet -continue OOB and ambulation -pain meds PRN -home meds -bowel regimen -SCDS on at all times while in bed -anticipated d/c tomorrow   LOS: 2 days   Joanne Greene D Binyomin Brann 11/20/2019, 10:16 AM

## 2019-11-20 NOTE — Progress Notes (Signed)
Patient has ambulated 180 ft twice. Activity was tolerated moderately. Pt has worsening pain with activity. Will continue to monitor and encourage activity.

## 2019-11-21 LAB — SURGICAL PATHOLOGY

## 2019-11-21 NOTE — Discharge Summary (Signed)
Date of admission: 11/18/2019  Date of discharge: 11/21/2019  Admission diagnosis: Atrophic left kidney  Discharge diagnosis: Same  Procedures:  Robotic left simple nephrectomy on 11/18/19  History and Physical: For full details, please see admission history and physical. Briefly, Joanne Greene is a 73 y.o. year old patient with a congential left UPJ obstruction and left renal atrophy as well as chronic left flank pain.   Hospital Course: Following surgery, the patient was monitored on the floor.  She had a syncopal episode on POD1 with no clear etiology following a complete work-up.  By POD3, the patient was ambulating, tolerating a regular diet, passing flatus and her pain/nausea were well controlled.    Physical Exam:  General: Alert and oriented CV: RRR, palpable distal pulses Lungs: CTAB, equal chest rise Abdomen: Soft, NTND, no rebound or guarding Incisions: Clean, dry and intact Ext: NT, No erythema  Laboratory values:  Recent Labs    11/18/19 1538 11/19/19 0455  HGB 11.9* 11.0*  HCT 36.6 34.3*   Recent Labs    11/19/19 0455  CREATININE 1.22*    Disposition: Home  Discharge instruction: The patient was instructed to be ambulatory but told to refrain from heavy lifting, strenuous activity, or driving.  Discharge medications:  Allergies as of 11/21/2019      Reactions   Caffeine    Chest tightness   Demerol [meperidine Hcl] Nausea Only      Medication List    STOP taking these medications   Vitamin D3 25 MCG (1000 UT) Caps     TAKE these medications   docusate sodium 100 MG capsule Commonly known as: COLACE Take 1 capsule (100 mg total) by mouth 2 (two) times daily.   HYDROcodone-acetaminophen 5-325 MG tablet Commonly known as: Norco Take 1-2 tablets by mouth every 6 (six) hours as needed.   promethazine 12.5 MG tablet Commonly known as: PHENERGAN Take 1 tablet (12.5 mg total) by mouth every 4 (four) hours as needed for nausea or vomiting.        Followup:  Follow-up Information    Ceasar Mons, MD On 12/02/2019.   Specialty: Urology Why: at 10:00 Contact information: [address] [place] [address] [phone]

## 2019-11-21 NOTE — Progress Notes (Signed)
Discharge instructions were reviewed. Questions, concerns denied. No change from am assessment. Pt a&ox4, ambulatory without assistance.

## 2019-11-28 ENCOUNTER — Ambulatory Visit: Payer: Medicare Other | Admitting: Podiatry

## 2019-12-02 DIAGNOSIS — N270 Small kidney, unilateral: Secondary | ICD-10-CM | POA: Diagnosis not present

## 2019-12-26 DIAGNOSIS — Z1159 Encounter for screening for other viral diseases: Secondary | ICD-10-CM | POA: Diagnosis not present

## 2020-01-02 DIAGNOSIS — Q6211 Congenital occlusion of ureteropelvic junction: Secondary | ICD-10-CM | POA: Diagnosis not present

## 2020-01-04 DIAGNOSIS — M5137 Other intervertebral disc degeneration, lumbosacral region: Secondary | ICD-10-CM | POA: Diagnosis not present

## 2020-01-04 DIAGNOSIS — M9903 Segmental and somatic dysfunction of lumbar region: Secondary | ICD-10-CM | POA: Diagnosis not present

## 2020-01-04 DIAGNOSIS — M9902 Segmental and somatic dysfunction of thoracic region: Secondary | ICD-10-CM | POA: Diagnosis not present

## 2020-01-04 DIAGNOSIS — M9904 Segmental and somatic dysfunction of sacral region: Secondary | ICD-10-CM | POA: Diagnosis not present

## 2020-01-04 DIAGNOSIS — M5136 Other intervertebral disc degeneration, lumbar region: Secondary | ICD-10-CM | POA: Diagnosis not present

## 2020-01-04 DIAGNOSIS — M546 Pain in thoracic spine: Secondary | ICD-10-CM | POA: Diagnosis not present

## 2020-01-04 DIAGNOSIS — M5441 Lumbago with sciatica, right side: Secondary | ICD-10-CM | POA: Diagnosis not present

## 2020-01-10 ENCOUNTER — Ambulatory Visit (INDEPENDENT_AMBULATORY_CARE_PROVIDER_SITE_OTHER): Payer: Medicare Other | Admitting: Podiatry

## 2020-01-10 ENCOUNTER — Other Ambulatory Visit: Payer: Self-pay

## 2020-01-10 DIAGNOSIS — M722 Plantar fascial fibromatosis: Secondary | ICD-10-CM

## 2020-01-10 DIAGNOSIS — L600 Ingrowing nail: Secondary | ICD-10-CM

## 2020-01-10 DIAGNOSIS — M79676 Pain in unspecified toe(s): Secondary | ICD-10-CM | POA: Diagnosis not present

## 2020-01-10 MED ORDER — NEOMYCIN-POLYMYXIN-HC 3.5-10000-1 OT SOLN: OTIC | 0 refills | Status: DC

## 2020-01-10 NOTE — Patient Instructions (Signed)

## 2020-01-10 NOTE — Progress Notes (Signed)
  Subjective:  Patient ID: Joanne Greene, female    DOB: 05-06-1946,  MRN: [id number]  Chief Complaint  Patient presents with  . Plantar Fasciitis    F/u Rt PF Pt. staets," the pain has been better. No pain at all." tx: PF brace  . Nail Problem    pt c/o Rt hallux lateral border pain x several mo; very sensitive -pt denies redness/swellgin/draiang eTx: nonbe    74 y.o. female presents with the above complaint. History confirmed with patient.   Objective:  Physical Exam: warm, good capillary refill, no trophic changes or ulcerative lesions, normal DP and PT pulses and normal sensory exam.  Painful ingrowing nail at lateral border of the right, hallux; without warmth, erythema or drainage  No pain to palpation to the right heel.  Assessment:   1. Plantar fasciitis   2. Ingrown nail   3. Pain around toenail      Plan:  Patient was evaluated and treated and all questions answered.  Ingrown Nail, right -Patient elects to proceed with ingrown toenail removal today -Ingrown nail excised. See procedure note. -Educated on post-procedure care including soaking. Written instructions provided. -Rx for Cortisporin drops -Patient to follow up in 2 weeks for nail check.  Procedure: Excision of Ingrown Toenail Location: Right 1st toe lateral nail borders. Anesthesia: Lidocaine 1% plain; 1.36mL and Marcaine 0.5% plain; 1.31mL, digital block. Skin Prep: Betadine. Dressing: Silvadene; telfa; dry, sterile, compression dressing. Technique: Following skin prep, the toe was exsanguinated and a tourniquet was secured at the base of the toe. The affected nail border was freed, split with a nail splitter, and excised. Chemical matrixectomy was then performed with phenol and irrigated out with alcohol. The tourniquet was then removed and sterile dressing applied. Disposition: Patient tolerated procedure well. Patient to return in 2 weeks for follow-up.   Plantar Fasciitis -Resolved, no pain  today. -Continue CMOs.  Return in about 2 weeks (around 01/24/2020) for Nail Check.   MDM

## 2020-01-24 ENCOUNTER — Ambulatory Visit (INDEPENDENT_AMBULATORY_CARE_PROVIDER_SITE_OTHER): Payer: Medicare Other | Admitting: Podiatry

## 2020-01-24 ENCOUNTER — Other Ambulatory Visit: Payer: Self-pay

## 2020-01-24 DIAGNOSIS — M79676 Pain in unspecified toe(s): Secondary | ICD-10-CM | POA: Diagnosis not present

## 2020-01-24 DIAGNOSIS — L600 Ingrowing nail: Secondary | ICD-10-CM

## 2020-01-24 NOTE — Progress Notes (Signed)
  Subjective:  Patient ID: Joanne Greene, female    DOB: 12-17-1945,  MRN: [id number]  Chief Complaint  Patient presents with  . nail check    F/U Rt hallux nail check pt. states," it's great." -pt denies pain/swelling -w/ pinkish color -pt dneis drianageTx: epsom satl and drops    74 y.o. female presents for follow up of nail procedure. History confirmed with patient.   Objective:  Physical Exam: Ingrown nail avulsion site: overlying soft crust, no warmth, no drainage and no erythema Assessment:   1. Ingrown nail   2. Pain around toenail      Plan:  Patient was evaluated and treated and all questions answered.  S/p Ingrown Toenail Excision, right -Healing well without issue. -Discussed return precautions. -F/u PRN

## 2020-02-08 DIAGNOSIS — M546 Pain in thoracic spine: Secondary | ICD-10-CM | POA: Diagnosis not present

## 2020-02-08 DIAGNOSIS — M9904 Segmental and somatic dysfunction of sacral region: Secondary | ICD-10-CM | POA: Diagnosis not present

## 2020-02-08 DIAGNOSIS — M5137 Other intervertebral disc degeneration, lumbosacral region: Secondary | ICD-10-CM | POA: Diagnosis not present

## 2020-02-08 DIAGNOSIS — M9903 Segmental and somatic dysfunction of lumbar region: Secondary | ICD-10-CM | POA: Diagnosis not present

## 2020-02-08 DIAGNOSIS — M5136 Other intervertebral disc degeneration, lumbar region: Secondary | ICD-10-CM | POA: Diagnosis not present

## 2020-02-08 DIAGNOSIS — M5441 Lumbago with sciatica, right side: Secondary | ICD-10-CM | POA: Diagnosis not present

## 2020-02-08 DIAGNOSIS — M9902 Segmental and somatic dysfunction of thoracic region: Secondary | ICD-10-CM | POA: Diagnosis not present

## 2020-03-07 DIAGNOSIS — M5137 Other intervertebral disc degeneration, lumbosacral region: Secondary | ICD-10-CM | POA: Diagnosis not present

## 2020-03-07 DIAGNOSIS — M5441 Lumbago with sciatica, right side: Secondary | ICD-10-CM | POA: Diagnosis not present

## 2020-03-07 DIAGNOSIS — M5136 Other intervertebral disc degeneration, lumbar region: Secondary | ICD-10-CM | POA: Diagnosis not present

## 2020-03-07 DIAGNOSIS — M9902 Segmental and somatic dysfunction of thoracic region: Secondary | ICD-10-CM | POA: Diagnosis not present

## 2020-03-07 DIAGNOSIS — M9903 Segmental and somatic dysfunction of lumbar region: Secondary | ICD-10-CM | POA: Diagnosis not present

## 2020-03-07 DIAGNOSIS — M9904 Segmental and somatic dysfunction of sacral region: Secondary | ICD-10-CM | POA: Diagnosis not present

## 2020-03-07 DIAGNOSIS — M546 Pain in thoracic spine: Secondary | ICD-10-CM | POA: Diagnosis not present

## 2020-04-04 DIAGNOSIS — M5136 Other intervertebral disc degeneration, lumbar region: Secondary | ICD-10-CM | POA: Diagnosis not present

## 2020-04-04 DIAGNOSIS — M9902 Segmental and somatic dysfunction of thoracic region: Secondary | ICD-10-CM | POA: Diagnosis not present

## 2020-04-04 DIAGNOSIS — M9903 Segmental and somatic dysfunction of lumbar region: Secondary | ICD-10-CM | POA: Diagnosis not present

## 2020-04-04 DIAGNOSIS — M5137 Other intervertebral disc degeneration, lumbosacral region: Secondary | ICD-10-CM | POA: Diagnosis not present

## 2020-04-04 DIAGNOSIS — M5441 Lumbago with sciatica, right side: Secondary | ICD-10-CM | POA: Diagnosis not present

## 2020-04-04 DIAGNOSIS — M9904 Segmental and somatic dysfunction of sacral region: Secondary | ICD-10-CM | POA: Diagnosis not present

## 2020-04-04 DIAGNOSIS — M546 Pain in thoracic spine: Secondary | ICD-10-CM | POA: Diagnosis not present

## 2020-04-26 ENCOUNTER — Other Ambulatory Visit: Payer: Self-pay | Admitting: Urology

## 2020-04-26 DIAGNOSIS — N270 Small kidney, unilateral: Secondary | ICD-10-CM

## 2020-05-03 DIAGNOSIS — M9903 Segmental and somatic dysfunction of lumbar region: Secondary | ICD-10-CM | POA: Diagnosis not present

## 2020-05-03 DIAGNOSIS — M5137 Other intervertebral disc degeneration, lumbosacral region: Secondary | ICD-10-CM | POA: Diagnosis not present

## 2020-05-03 DIAGNOSIS — M9904 Segmental and somatic dysfunction of sacral region: Secondary | ICD-10-CM | POA: Diagnosis not present

## 2020-05-03 DIAGNOSIS — M546 Pain in thoracic spine: Secondary | ICD-10-CM | POA: Diagnosis not present

## 2020-05-03 DIAGNOSIS — M9902 Segmental and somatic dysfunction of thoracic region: Secondary | ICD-10-CM | POA: Diagnosis not present

## 2020-05-03 DIAGNOSIS — M5136 Other intervertebral disc degeneration, lumbar region: Secondary | ICD-10-CM | POA: Diagnosis not present

## 2020-05-03 DIAGNOSIS — M5441 Lumbago with sciatica, right side: Secondary | ICD-10-CM | POA: Diagnosis not present

## 2020-05-07 DIAGNOSIS — H353130 Nonexudative age-related macular degeneration, bilateral, stage unspecified: Secondary | ICD-10-CM | POA: Diagnosis not present

## 2020-05-10 DIAGNOSIS — H353132 Nonexudative age-related macular degeneration, bilateral, intermediate dry stage: Secondary | ICD-10-CM | POA: Diagnosis not present

## 2020-05-10 DIAGNOSIS — H318 Other specified disorders of choroid: Secondary | ICD-10-CM | POA: Diagnosis not present

## 2020-05-17 ENCOUNTER — Other Ambulatory Visit: Payer: Self-pay | Admitting: Urology

## 2020-05-17 DIAGNOSIS — N270 Small kidney, unilateral: Secondary | ICD-10-CM

## 2020-05-17 DIAGNOSIS — N261 Atrophy of kidney (terminal): Secondary | ICD-10-CM

## 2020-05-21 ENCOUNTER — Ambulatory Visit
Admission: RE | Admit: 2020-05-21 | Discharge: 2020-05-21 | Disposition: A | Discharge status: Home / Self Care | Payer: Medicare Other | Source: Ambulatory Visit | Attending: Urology | Admitting: Urology

## 2020-05-21 DIAGNOSIS — N1330 Unspecified hydronephrosis: Secondary | ICD-10-CM | POA: Diagnosis not present

## 2020-05-21 DIAGNOSIS — N270 Small kidney, unilateral: Secondary | ICD-10-CM

## 2020-05-21 DIAGNOSIS — N261 Atrophy of kidney (terminal): Secondary | ICD-10-CM

## 2020-05-28 DIAGNOSIS — R1084 Generalized abdominal pain: Secondary | ICD-10-CM | POA: Diagnosis not present

## 2020-05-28 DIAGNOSIS — Q6211 Congenital occlusion of ureteropelvic junction: Secondary | ICD-10-CM | POA: Diagnosis not present

## 2020-05-30 DIAGNOSIS — H318 Other specified disorders of choroid: Secondary | ICD-10-CM | POA: Diagnosis not present

## 2020-05-31 DIAGNOSIS — M9902 Segmental and somatic dysfunction of thoracic region: Secondary | ICD-10-CM | POA: Diagnosis not present

## 2020-05-31 DIAGNOSIS — M9904 Segmental and somatic dysfunction of sacral region: Secondary | ICD-10-CM | POA: Diagnosis not present

## 2020-05-31 DIAGNOSIS — M5136 Other intervertebral disc degeneration, lumbar region: Secondary | ICD-10-CM | POA: Diagnosis not present

## 2020-05-31 DIAGNOSIS — M5137 Other intervertebral disc degeneration, lumbosacral region: Secondary | ICD-10-CM | POA: Diagnosis not present

## 2020-05-31 DIAGNOSIS — M9903 Segmental and somatic dysfunction of lumbar region: Secondary | ICD-10-CM | POA: Diagnosis not present

## 2020-05-31 DIAGNOSIS — M546 Pain in thoracic spine: Secondary | ICD-10-CM | POA: Diagnosis not present

## 2020-05-31 DIAGNOSIS — M5441 Lumbago with sciatica, right side: Secondary | ICD-10-CM | POA: Diagnosis not present

## 2020-06-11 IMAGING — NM NM RENAL IMAGING FLOW W/ PHARM
2 series · 12 of 12 positions shown · non-contrast
Comparison: Abdominopelvic CT 08/26/2019. Nuclear medicine renal
imaging 02/02/2019 and 06/15/2018.

CLINICAL DATA: Congenital left UPJ obstruction. No current symptoms
or previous relevant surgery.

EXAM:
NUCLEAR MEDICINE RENAL SCAN WITH DIURETIC ADMINISTRATION
TECHNIQUE: Radionuclide angiographic and sequential renal images were obtained
after intravenous injection of radiopharmaceutical. Imaging was
continued during slow intravenous injection of Lasix approximately
15 minutes after the start of the examination.
RADIOPHARMACEUTICALS:  4.7 mCi Xechnetium-EEm MAG3 IV. 36 milligrams
intravenous Lasix.

[Series 1: renal scan · 4.14mm/px · 6 of 40 frames shown (1 of 2)]
[frame 4/40  full-range]
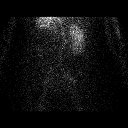
[frame 10/40  full-range]
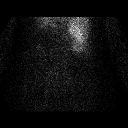
[frame 17/40  full-range]
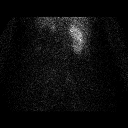
[frame 24/40  full-range]
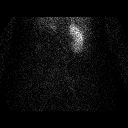
[frame 30/40  full-range]
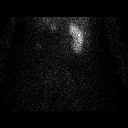
[frame 37/40  full-range]
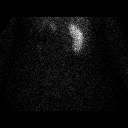

[Series 1: renal scan · 4.14mm/px · 6 of 71 frames shown (2 of 2)]
[frame 6/71]
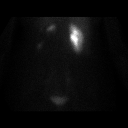
[frame 18/71]
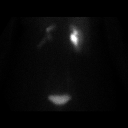
[frame 30/71]
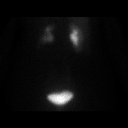
[frame 42/71]
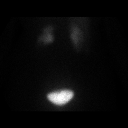
[frame 54/71]
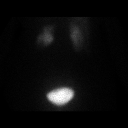
[frame 66/71]
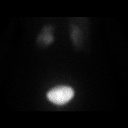

[12 of 12 positions shown; findings below may reference images not displayed]

FINDINGS: Flow: There is normal prompt flow to the right kidney. There is
chronically diminished and delayed arterial flow to the left kidney,
similar to the previous study.

Left renogram: Delayed renal cortical uptake and excretion into a
dilated renal collecting system, similar to the previous study.
Radiogram curve demonstrates progressive upsloping without
significant washout following diuretic.

Right renogram: Normal renal cortical uptake and excretion. Mild
dilatation of the right renal pelvis with good washout following
diuresis. Fall to half maximum tracer activity at approximately
minutes.

Differential:

Left kidney = 18 %

Right kidney = 82 %

T1/2 post Lasix :

Left kidney = NA min

Right kidney = 5.9 min
IMPRESSION: 1. Little change is seen compared with the previous nuclear medicine
renal scan from 7 months ago. Persistent decreased left renal
perfusion and function with differential renal function of 82% right
and 18% left. (Previously 91% right and 9% left).
2. Persistent left UPJ obstruction without significant washout
following diuretic.
3. Mild collecting system dilatation on the right with good washout
following diuretic.

## 2020-06-12 DIAGNOSIS — H25813 Combined forms of age-related cataract, bilateral: Secondary | ICD-10-CM

## 2020-06-12 HISTORY — DX: Combined forms of age-related cataract, bilateral: H25.813

## 2020-06-19 DIAGNOSIS — Z021 Encounter for pre-employment examination: Secondary | ICD-10-CM | POA: Diagnosis not present

## 2020-06-20 DIAGNOSIS — H318 Other specified disorders of choroid: Secondary | ICD-10-CM

## 2020-06-20 DIAGNOSIS — H2513 Age-related nuclear cataract, bilateral: Secondary | ICD-10-CM

## 2020-06-20 DIAGNOSIS — H353130 Nonexudative age-related macular degeneration, bilateral, stage unspecified: Secondary | ICD-10-CM

## 2020-06-20 HISTORY — DX: Age-related nuclear cataract, bilateral: H25.13

## 2020-06-20 HISTORY — DX: Nonexudative age-related macular degeneration, bilateral, stage unspecified: H35.3130

## 2020-06-20 HISTORY — DX: Other specified disorders of choroid: H31.8

## 2020-06-26 DIAGNOSIS — H2513 Age-related nuclear cataract, bilateral: Secondary | ICD-10-CM | POA: Diagnosis not present

## 2020-06-26 DIAGNOSIS — D3131 Benign neoplasm of right choroid: Secondary | ICD-10-CM | POA: Diagnosis not present

## 2020-06-26 DIAGNOSIS — H353131 Nonexudative age-related macular degeneration, bilateral, early dry stage: Secondary | ICD-10-CM | POA: Diagnosis not present

## 2020-07-02 DIAGNOSIS — I491 Atrial premature depolarization: Secondary | ICD-10-CM | POA: Diagnosis not present

## 2020-07-02 DIAGNOSIS — R002 Palpitations: Secondary | ICD-10-CM | POA: Diagnosis not present

## 2020-07-19 ENCOUNTER — Ambulatory Visit: Payer: Medicare Other | Admitting: Cardiology

## 2020-07-19 DIAGNOSIS — R519 Headache, unspecified: Secondary | ICD-10-CM | POA: Diagnosis not present

## 2020-07-19 DIAGNOSIS — I10 Essential (primary) hypertension: Secondary | ICD-10-CM | POA: Diagnosis not present

## 2020-07-19 DIAGNOSIS — G459 Transient cerebral ischemic attack, unspecified: Secondary | ICD-10-CM | POA: Diagnosis not present

## 2020-07-19 DIAGNOSIS — R531 Weakness: Secondary | ICD-10-CM | POA: Diagnosis not present

## 2020-07-19 DIAGNOSIS — N179 Acute kidney failure, unspecified: Secondary | ICD-10-CM | POA: Diagnosis not present

## 2020-07-19 DIAGNOSIS — G4489 Other headache syndrome: Secondary | ICD-10-CM | POA: Diagnosis not present
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
## 2020-07-20 DIAGNOSIS — N1831 Chronic kidney disease, stage 3a: Secondary | ICD-10-CM | POA: Diagnosis not present

## 2020-07-20 DIAGNOSIS — R251 Tremor, unspecified: Secondary | ICD-10-CM | POA: Diagnosis not present

## 2020-07-20 DIAGNOSIS — R55 Syncope and collapse: Secondary | ICD-10-CM | POA: Diagnosis not present

## 2020-07-24 DIAGNOSIS — M5136 Other intervertebral disc degeneration, lumbar region: Secondary | ICD-10-CM | POA: Diagnosis not present

## 2020-07-24 DIAGNOSIS — M9904 Segmental and somatic dysfunction of sacral region: Secondary | ICD-10-CM | POA: Diagnosis not present

## 2020-07-24 DIAGNOSIS — M5137 Other intervertebral disc degeneration, lumbosacral region: Secondary | ICD-10-CM | POA: Diagnosis not present

## 2020-07-24 DIAGNOSIS — M9903 Segmental and somatic dysfunction of lumbar region: Secondary | ICD-10-CM | POA: Diagnosis not present

## 2020-07-24 DIAGNOSIS — M546 Pain in thoracic spine: Secondary | ICD-10-CM | POA: Diagnosis not present

## 2020-07-24 DIAGNOSIS — M5441 Lumbago with sciatica, right side: Secondary | ICD-10-CM | POA: Diagnosis not present

## 2020-07-24 DIAGNOSIS — M9902 Segmental and somatic dysfunction of thoracic region: Secondary | ICD-10-CM | POA: Diagnosis not present

## 2020-07-26 ENCOUNTER — Ambulatory Visit: Payer: Medicare Other | Admitting: Cardiology

## 2020-07-30 ENCOUNTER — Other Ambulatory Visit: Payer: Self-pay

## 2020-07-30 DIAGNOSIS — M810 Age-related osteoporosis without current pathological fracture: Secondary | ICD-10-CM | POA: Insufficient documentation

## 2020-07-30 DIAGNOSIS — L719 Rosacea, unspecified: Secondary | ICD-10-CM | POA: Insufficient documentation

## 2020-07-30 DIAGNOSIS — N1330 Unspecified hydronephrosis: Secondary | ICD-10-CM | POA: Insufficient documentation

## 2020-07-30 DIAGNOSIS — M419 Scoliosis, unspecified: Secondary | ICD-10-CM | POA: Insufficient documentation

## 2020-07-30 DIAGNOSIS — R251 Tremor, unspecified: Secondary | ICD-10-CM | POA: Insufficient documentation

## 2020-07-30 DIAGNOSIS — N189 Chronic kidney disease, unspecified: Secondary | ICD-10-CM | POA: Insufficient documentation

## 2020-08-01 ENCOUNTER — Encounter: Payer: Self-pay | Admitting: Cardiology

## 2020-08-01 ENCOUNTER — Other Ambulatory Visit: Payer: Self-pay

## 2020-08-01 ENCOUNTER — Ambulatory Visit (INDEPENDENT_AMBULATORY_CARE_PROVIDER_SITE_OTHER): Payer: Medicare Other | Admitting: Cardiology

## 2020-08-01 VITALS — BP 154/90 | HR 98 | Ht 67.0 in | Wt 162.0 lb

## 2020-08-01 DIAGNOSIS — I771 Stricture of artery: Secondary | ICD-10-CM | POA: Diagnosis not present

## 2020-08-01 DIAGNOSIS — R0600 Dyspnea, unspecified: Secondary | ICD-10-CM | POA: Diagnosis not present

## 2020-08-01 DIAGNOSIS — R002 Palpitations: Secondary | ICD-10-CM

## 2020-08-01 DIAGNOSIS — R251 Tremor, unspecified: Secondary | ICD-10-CM | POA: Diagnosis not present

## 2020-08-01 DIAGNOSIS — I6523 Occlusion and stenosis of bilateral carotid arteries: Secondary | ICD-10-CM | POA: Diagnosis not present

## 2020-08-01 DIAGNOSIS — R55 Syncope and collapse: Secondary | ICD-10-CM

## 2020-08-01 DIAGNOSIS — R0609 Other forms of dyspnea: Secondary | ICD-10-CM

## 2020-08-01 HISTORY — DX: Other forms of dyspnea: R06.09

## 2020-08-01 HISTORY — DX: Palpitations: R00.2

## 2020-08-01 HISTORY — DX: Dyspnea, unspecified: R06.00

## 2020-08-01 HISTORY — DX: Syncope and collapse: R55
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
## 2020-08-01 NOTE — Patient Instructions (Signed)
Medication Instructions:  Your physician recommends that you continue on your current medications as directed. Please refer to the Current Medication list given to you today.  *If you need a refill on your cardiac medications before your next appointment, please call your pharmacy*   Lab Work: None If you have labs (blood work) drawn today and your tests are completely normal, you will receive your results only by: . MyChart Message (if you have MyChart) OR . A paper copy in the mail If you have any lab test that is abnormal or we need to change your treatment, we will call you to review the results.   Testing/Procedures: Your physician has requested that you have an echocardiogram. Echocardiography is a painless test that uses sound waves to create images of your heart. It provides your doctor with information about the size and shape of your heart and how well your heart's chambers and valves are working. This procedure takes approximately one hour. There are no restrictions for this procedure.  A zio monitor was ordered today. It will remain on for 7 days. You will then return monitor and event diary in provided box. It takes 1-2 weeks for report to be downloaded and returned to us. We will call you with the results. If monitor falls off or has orange flashing light, please call [employer] for further instructions.      Follow-Up: At [facility], you and your health needs are our priority.  As part of our continuing mission to provide you with exceptional heart care, we have created designated Provider Care Teams.  These Care Teams include your primary Cardiologist (physician) and Advanced Practice Providers (APPs -  Physician Assistants and Nurse Practitioners) who all work together to provide you with the care you need, when you need it.  We recommend signing up for the patient portal called "MyChart".  Sign up information is provided on this After Visit Summary.  MyChart is used to connect  with patients for Virtual Visits (Telemedicine).  Patients are able to view lab/test results, encounter notes, upcoming appointments, etc.  Non-urgent messages can be sent to your provider as well.   To learn more about what you can do with MyChart, go to [email or web address].    Your next appointment:   2 month(s)  The format for your next appointment:   In Person  Provider:   Robert Krasowski, MD   Other Instructions   Echocardiogram An echocardiogram is a procedure that uses painless sound waves (ultrasound) to produce an image of the heart. Images from an echocardiogram can provide important information about:  Signs of coronary artery disease (CAD).  Aneurysm detection. An aneurysm is a weak or damaged part of an artery wall that bulges out from the normal force of blood pumping through the body.  Heart size and shape. Changes in the size or shape of the heart can be associated with certain conditions, including heart failure, aneurysm, and CAD.  Heart muscle function.  Heart valve function.  Signs of a past heart attack.  Fluid buildup around the heart.  Thickening of the heart muscle.  A tumor or infectious growth around the heart valves. Tell a health care provider about:  Any allergies you have.  All medicines you are taking, including vitamins, herbs, eye drops, creams, and over-the-counter medicines.  Any blood disorders you have.  Any surgeries you have had.  Any medical conditions you have.  Whether you are pregnant or may be pregnant. What are the risks? Generally,   Generally, this is a safe procedure. However, problems may occur, including:  Allergic reaction to dye (contrast) that may be used during the procedure. What happens before the procedure? No specific preparation is needed. You may eat and drink normally. What happens during the procedure?   An IV tube may be inserted into one of your veins.  You may receive contrast through this  tube. A contrast is an injection that improves the quality of the pictures from your heart.  A gel will be applied to your chest.  A wand-like tool (transducer) will be moved over your chest. The gel will help to transmit the sound waves from the transducer.  The sound waves will harmlessly bounce off of your heart to allow the heart images to be captured in real-time motion. The images will be recorded on a computer. The procedure may vary among health care providers and hospitals. What happens after the procedure?  You may return to your normal, everyday life, including diet, activities, and medicines, unless your health care provider tells you not to do that. Summary  An echocardiogram is a procedure that uses painless sound waves (ultrasound) to produce an image of the heart.  Images from an echocardiogram can provide important information about the size and shape of your heart, heart muscle function, heart valve function, and fluid buildup around your heart.  You do not need to do anything to prepare before this procedure. You may eat and drink normally.  After the echocardiogram is completed, you may return to your normal, everyday life, unless your health care provider tells you not to do that. This information is not intended to replace advice given to you by your health care provider. Make sure you discuss any questions you have with your health care provider. Document Revised: 03/24/2019 Document Reviewed: 01/03/2017 Elsevier Patient Education  [place].

## 2020-08-03 ENCOUNTER — Ambulatory Visit (INDEPENDENT_AMBULATORY_CARE_PROVIDER_SITE_OTHER): Payer: Medicare Other

## 2020-08-03 DIAGNOSIS — R002 Palpitations: Secondary | ICD-10-CM | POA: Diagnosis not present

## 2020-08-08 ENCOUNTER — Other Ambulatory Visit: Payer: Self-pay

## 2020-08-08 ENCOUNTER — Ambulatory Visit (INDEPENDENT_AMBULATORY_CARE_PROVIDER_SITE_OTHER): Payer: Medicare Other

## 2020-08-08 DIAGNOSIS — R0600 Dyspnea, unspecified: Secondary | ICD-10-CM

## 2020-08-08 DIAGNOSIS — R002 Palpitations: Secondary | ICD-10-CM

## 2020-08-08 DIAGNOSIS — R0609 Other forms of dyspnea: Secondary | ICD-10-CM

## 2020-08-08 LAB — ECHOCARDIOGRAM COMPLETE
Area-P 1/2: 3.06 cm2
S' Lateral: 3 cm

## 2020-08-08 NOTE — Progress Notes (Signed)
Complete echocardiogram performed.  Jimmy Spencer Cardinal RDCS, RVT  

## 2020-08-20 DIAGNOSIS — R002 Palpitations: Secondary | ICD-10-CM | POA: Diagnosis not present

## 2020-08-22 DIAGNOSIS — N1330 Unspecified hydronephrosis: Secondary | ICD-10-CM | POA: Diagnosis not present

## 2020-08-22 DIAGNOSIS — N1831 Chronic kidney disease, stage 3a: Secondary | ICD-10-CM | POA: Diagnosis not present

## 2020-08-22 DIAGNOSIS — Z6824 Body mass index (BMI) 24.0-24.9, adult: Secondary | ICD-10-CM | POA: Diagnosis not present

## 2020-08-22 DIAGNOSIS — M419 Scoliosis, unspecified: Secondary | ICD-10-CM | POA: Diagnosis not present

## 2020-08-22 DIAGNOSIS — G252 Other specified forms of tremor: Secondary | ICD-10-CM | POA: Diagnosis not present

## 2020-08-22 DIAGNOSIS — M810 Age-related osteoporosis without current pathological fracture: Secondary | ICD-10-CM | POA: Diagnosis not present

## 2020-08-22 DIAGNOSIS — R002 Palpitations: Secondary | ICD-10-CM | POA: Diagnosis not present

## 2020-08-22 DIAGNOSIS — Z1389 Encounter for screening for other disorder: Secondary | ICD-10-CM | POA: Diagnosis not present

## 2020-08-28 ENCOUNTER — Telehealth: Payer: Self-pay | Admitting: Cardiology

## 2020-08-28 NOTE — Telephone Encounter (Signed)
Called patient back informed her of results.

## 2020-08-28 NOTE — Telephone Encounter (Signed)
Pt is returning Hayley's call in regards to results.

## 2020-08-29 ENCOUNTER — Telehealth: Payer: Self-pay | Admitting: Cardiology

## 2020-08-29 NOTE — Telephone Encounter (Signed)
Patient came by office today (08/29/20) and requested a copy of the monitor report. She signed ROI and nurse printed the report. Patient was not satisfied with the report she received, stated she has questions for the doctor. I told her I would send a message to the nurse to give her a call to address any questions or concerns.

## 2020-08-29 NOTE — Telephone Encounter (Signed)
Patient wants the actual report from [employer] was told this can't be given out unless during an appointment. Appointment was made for patient to see Dr. Agustin Cree.

## 2020-08-30 DIAGNOSIS — N183 Chronic kidney disease, stage 3 unspecified: Secondary | ICD-10-CM | POA: Diagnosis not present

## 2020-08-30 DIAGNOSIS — Q6211 Congenital occlusion of ureteropelvic junction: Secondary | ICD-10-CM | POA: Diagnosis not present

## 2020-09-04 DIAGNOSIS — Z1231 Encounter for screening mammogram for malignant neoplasm of breast: Secondary | ICD-10-CM | POA: Diagnosis not present

## 2020-09-10 DIAGNOSIS — M5136 Other intervertebral disc degeneration, lumbar region: Secondary | ICD-10-CM | POA: Diagnosis not present

## 2020-09-10 DIAGNOSIS — M9902 Segmental and somatic dysfunction of thoracic region: Secondary | ICD-10-CM | POA: Diagnosis not present

## 2020-09-10 DIAGNOSIS — M546 Pain in thoracic spine: Secondary | ICD-10-CM | POA: Diagnosis not present

## 2020-09-10 DIAGNOSIS — M9904 Segmental and somatic dysfunction of sacral region: Secondary | ICD-10-CM | POA: Diagnosis not present

## 2020-09-10 DIAGNOSIS — M5137 Other intervertebral disc degeneration, lumbosacral region: Secondary | ICD-10-CM | POA: Diagnosis not present

## 2020-09-10 DIAGNOSIS — M9903 Segmental and somatic dysfunction of lumbar region: Secondary | ICD-10-CM | POA: Diagnosis not present

## 2020-09-10 DIAGNOSIS — M5441 Lumbago with sciatica, right side: Secondary | ICD-10-CM | POA: Diagnosis not present

## 2020-09-14 ENCOUNTER — Encounter: Payer: Self-pay | Admitting: Neurology

## 2020-10-04 ENCOUNTER — Other Ambulatory Visit: Payer: Self-pay

## 2020-10-04 ENCOUNTER — Ambulatory Visit (INDEPENDENT_AMBULATORY_CARE_PROVIDER_SITE_OTHER): Payer: Medicare Other | Admitting: Cardiology

## 2020-10-04 ENCOUNTER — Encounter: Payer: Self-pay | Admitting: Cardiology

## 2020-10-04 VITALS — BP 140/82 | HR 74 | Ht 67.0 in | Wt 165.0 lb

## 2020-10-04 DIAGNOSIS — I471 Supraventricular tachycardia, unspecified: Secondary | ICD-10-CM | POA: Insufficient documentation

## 2020-10-04 DIAGNOSIS — R251 Tremor, unspecified: Secondary | ICD-10-CM

## 2020-10-04 DIAGNOSIS — R55 Syncope and collapse: Secondary | ICD-10-CM | POA: Diagnosis not present
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
## 2020-10-04 NOTE — Patient Instructions (Signed)
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
## 2020-10-08 ENCOUNTER — Other Ambulatory Visit: Payer: Self-pay

## 2020-10-08 ENCOUNTER — Ambulatory Visit (INDEPENDENT_AMBULATORY_CARE_PROVIDER_SITE_OTHER): Payer: Medicare Other | Admitting: Neurology

## 2020-10-08 ENCOUNTER — Encounter: Payer: Self-pay | Admitting: Neurology

## 2020-10-08 VITALS — BP 157/85 | HR 72 | Ht 67.0 in | Wt 164.0 lb

## 2020-10-08 DIAGNOSIS — G243 Spasmodic torticollis: Secondary | ICD-10-CM

## 2020-10-08 DIAGNOSIS — F488 Other specified nonpsychotic mental disorders: Secondary | ICD-10-CM | POA: Diagnosis not present

## 2020-10-08 DIAGNOSIS — G249 Dystonia, unspecified: Secondary | ICD-10-CM

## 2020-10-08 DIAGNOSIS — J383 Other diseases of vocal cords: Secondary | ICD-10-CM | POA: Diagnosis not present

## 2020-10-08 NOTE — Patient Instructions (Addendum)
A referral to [facility] has been placed for your MRI someone will contact you directly to schedule your appt. They are located at [place]. Please contact them directly by calling [phone] with any questions regarding your referral.  Let me know if you change your mind on the genetic testing or trying the medication for the dystonia.  The physicians and staff at [facility] are committed to providing excellent care. You may receive a survey requesting feedback about your experience at our office. We strive to receive "very good" responses to the survey questions. If you feel that your experience would prevent you from giving the office a "very good " response, please contact our office to try to remedy the situation. We may be reached at [phone]. Thank you for taking the time out of your busy day to complete the survey.

## 2020-10-09 ENCOUNTER — Telehealth: Payer: Self-pay

## 2020-10-09 NOTE — Telephone Encounter (Signed)
Patient returned call and was given Dr Doristine Devoid recommendations. She voiced understanding and thanked me for calling.

## 2020-10-09 NOTE — Telephone Encounter (Signed)
Patient was seen in the office yesterday. Upon checking her out she had a question: if she could write with her left hand.   Spoke with Dr Tat and she states that is fine.   Left message on voicemail with Dr Doristine Devoid recommendations. Advised a call back with any questions or concerns.

## 2020-10-10 DIAGNOSIS — M9902 Segmental and somatic dysfunction of thoracic region: Secondary | ICD-10-CM | POA: Diagnosis not present

## 2020-10-10 DIAGNOSIS — M5136 Other intervertebral disc degeneration, lumbar region: Secondary | ICD-10-CM | POA: Diagnosis not present

## 2020-10-10 DIAGNOSIS — M9903 Segmental and somatic dysfunction of lumbar region: Secondary | ICD-10-CM | POA: Diagnosis not present

## 2020-10-10 DIAGNOSIS — M5441 Lumbago with sciatica, right side: Secondary | ICD-10-CM | POA: Diagnosis not present

## 2020-10-10 DIAGNOSIS — M9904 Segmental and somatic dysfunction of sacral region: Secondary | ICD-10-CM | POA: Diagnosis not present

## 2020-10-10 DIAGNOSIS — M546 Pain in thoracic spine: Secondary | ICD-10-CM | POA: Diagnosis not present

## 2020-10-10 DIAGNOSIS — M5137 Other intervertebral disc degeneration, lumbosacral region: Secondary | ICD-10-CM | POA: Diagnosis not present

## 2020-10-27 ENCOUNTER — Other Ambulatory Visit: Payer: Medicare Other

## 2020-11-07 DIAGNOSIS — M546 Pain in thoracic spine: Secondary | ICD-10-CM | POA: Diagnosis not present

## 2020-11-07 DIAGNOSIS — M9902 Segmental and somatic dysfunction of thoracic region: Secondary | ICD-10-CM | POA: Diagnosis not present

## 2020-11-07 DIAGNOSIS — M9903 Segmental and somatic dysfunction of lumbar region: Secondary | ICD-10-CM | POA: Diagnosis not present

## 2020-11-07 DIAGNOSIS — M9904 Segmental and somatic dysfunction of sacral region: Secondary | ICD-10-CM | POA: Diagnosis not present

## 2020-11-07 DIAGNOSIS — M5441 Lumbago with sciatica, right side: Secondary | ICD-10-CM | POA: Diagnosis not present

## 2020-11-07 DIAGNOSIS — M5136 Other intervertebral disc degeneration, lumbar region: Secondary | ICD-10-CM | POA: Diagnosis not present

## 2020-11-07 DIAGNOSIS — M5137 Other intervertebral disc degeneration, lumbosacral region: Secondary | ICD-10-CM | POA: Diagnosis not present

## 2020-11-28 DIAGNOSIS — M5441 Lumbago with sciatica, right side: Secondary | ICD-10-CM | POA: Diagnosis not present

## 2020-11-28 DIAGNOSIS — M5136 Other intervertebral disc degeneration, lumbar region: Secondary | ICD-10-CM | POA: Diagnosis not present

## 2020-11-28 DIAGNOSIS — M5137 Other intervertebral disc degeneration, lumbosacral region: Secondary | ICD-10-CM | POA: Diagnosis not present

## 2020-11-28 DIAGNOSIS — M9903 Segmental and somatic dysfunction of lumbar region: Secondary | ICD-10-CM | POA: Diagnosis not present

## 2020-11-28 DIAGNOSIS — M9904 Segmental and somatic dysfunction of sacral region: Secondary | ICD-10-CM | POA: Diagnosis not present

## 2020-11-28 DIAGNOSIS — M546 Pain in thoracic spine: Secondary | ICD-10-CM | POA: Diagnosis not present

## 2020-11-28 DIAGNOSIS — M9902 Segmental and somatic dysfunction of thoracic region: Secondary | ICD-10-CM | POA: Diagnosis not present

## 2020-11-30 DIAGNOSIS — I471 Supraventricular tachycardia: Secondary | ICD-10-CM | POA: Diagnosis not present

## 2021-01-09 DIAGNOSIS — M9903 Segmental and somatic dysfunction of lumbar region: Secondary | ICD-10-CM | POA: Diagnosis not present

## 2021-01-09 DIAGNOSIS — M546 Pain in thoracic spine: Secondary | ICD-10-CM | POA: Diagnosis not present

## 2021-01-09 DIAGNOSIS — M5137 Other intervertebral disc degeneration, lumbosacral region: Secondary | ICD-10-CM | POA: Diagnosis not present

## 2021-01-09 DIAGNOSIS — M9902 Segmental and somatic dysfunction of thoracic region: Secondary | ICD-10-CM | POA: Diagnosis not present

## 2021-01-09 DIAGNOSIS — M5441 Lumbago with sciatica, right side: Secondary | ICD-10-CM | POA: Diagnosis not present

## 2021-01-09 DIAGNOSIS — M5136 Other intervertebral disc degeneration, lumbar region: Secondary | ICD-10-CM | POA: Diagnosis not present

## 2021-01-09 DIAGNOSIS — M9904 Segmental and somatic dysfunction of sacral region: Secondary | ICD-10-CM | POA: Diagnosis not present

## 2021-02-06 DIAGNOSIS — M5137 Other intervertebral disc degeneration, lumbosacral region: Secondary | ICD-10-CM | POA: Diagnosis not present

## 2021-02-06 DIAGNOSIS — M9903 Segmental and somatic dysfunction of lumbar region: Secondary | ICD-10-CM | POA: Diagnosis not present

## 2021-02-06 DIAGNOSIS — M5136 Other intervertebral disc degeneration, lumbar region: Secondary | ICD-10-CM | POA: Diagnosis not present

## 2021-02-06 DIAGNOSIS — M9904 Segmental and somatic dysfunction of sacral region: Secondary | ICD-10-CM | POA: Diagnosis not present

## 2021-02-06 DIAGNOSIS — M5441 Lumbago with sciatica, right side: Secondary | ICD-10-CM | POA: Diagnosis not present

## 2021-02-06 DIAGNOSIS — M546 Pain in thoracic spine: Secondary | ICD-10-CM | POA: Diagnosis not present

## 2021-02-06 DIAGNOSIS — M9902 Segmental and somatic dysfunction of thoracic region: Secondary | ICD-10-CM | POA: Diagnosis not present

## 2021-03-13 DIAGNOSIS — M546 Pain in thoracic spine: Secondary | ICD-10-CM | POA: Diagnosis not present

## 2021-03-13 DIAGNOSIS — M5137 Other intervertebral disc degeneration, lumbosacral region: Secondary | ICD-10-CM | POA: Diagnosis not present

## 2021-03-13 DIAGNOSIS — M9903 Segmental and somatic dysfunction of lumbar region: Secondary | ICD-10-CM | POA: Diagnosis not present

## 2021-03-13 DIAGNOSIS — M5441 Lumbago with sciatica, right side: Secondary | ICD-10-CM | POA: Diagnosis not present

## 2021-03-13 DIAGNOSIS — M9902 Segmental and somatic dysfunction of thoracic region: Secondary | ICD-10-CM | POA: Diagnosis not present

## 2021-03-13 DIAGNOSIS — M9904 Segmental and somatic dysfunction of sacral region: Secondary | ICD-10-CM | POA: Diagnosis not present

## 2021-03-13 DIAGNOSIS — M5136 Other intervertebral disc degeneration, lumbar region: Secondary | ICD-10-CM | POA: Diagnosis not present

## 2021-03-25 ENCOUNTER — Other Ambulatory Visit: Payer: Self-pay | Admitting: Urology

## 2021-03-25 DIAGNOSIS — Q6211 Congenital occlusion of ureteropelvic junction: Secondary | ICD-10-CM

## 2021-04-03 DIAGNOSIS — N1831 Chronic kidney disease, stage 3a: Secondary | ICD-10-CM | POA: Diagnosis not present

## 2021-04-03 DIAGNOSIS — N1330 Unspecified hydronephrosis: Secondary | ICD-10-CM | POA: Diagnosis not present

## 2021-04-03 DIAGNOSIS — R002 Palpitations: Secondary | ICD-10-CM | POA: Diagnosis not present

## 2021-04-03 DIAGNOSIS — R0600 Dyspnea, unspecified: Secondary | ICD-10-CM | POA: Diagnosis not present

## 2021-04-10 DIAGNOSIS — M9904 Segmental and somatic dysfunction of sacral region: Secondary | ICD-10-CM | POA: Diagnosis not present

## 2021-04-10 DIAGNOSIS — M9903 Segmental and somatic dysfunction of lumbar region: Secondary | ICD-10-CM | POA: Diagnosis not present

## 2021-04-10 DIAGNOSIS — M9902 Segmental and somatic dysfunction of thoracic region: Secondary | ICD-10-CM | POA: Diagnosis not present

## 2021-04-10 DIAGNOSIS — M5137 Other intervertebral disc degeneration, lumbosacral region: Secondary | ICD-10-CM | POA: Diagnosis not present

## 2021-04-10 DIAGNOSIS — M546 Pain in thoracic spine: Secondary | ICD-10-CM | POA: Diagnosis not present

## 2021-04-10 DIAGNOSIS — M5441 Lumbago with sciatica, right side: Secondary | ICD-10-CM | POA: Diagnosis not present

## 2021-04-10 DIAGNOSIS — M5136 Other intervertebral disc degeneration, lumbar region: Secondary | ICD-10-CM | POA: Diagnosis not present

## 2021-04-11 DIAGNOSIS — M5137 Other intervertebral disc degeneration, lumbosacral region: Secondary | ICD-10-CM | POA: Diagnosis not present

## 2021-04-11 DIAGNOSIS — M5441 Lumbago with sciatica, right side: Secondary | ICD-10-CM | POA: Diagnosis not present

## 2021-04-11 DIAGNOSIS — M5136 Other intervertebral disc degeneration, lumbar region: Secondary | ICD-10-CM | POA: Diagnosis not present

## 2021-04-11 DIAGNOSIS — M546 Pain in thoracic spine: Secondary | ICD-10-CM | POA: Diagnosis not present

## 2021-04-11 DIAGNOSIS — M9902 Segmental and somatic dysfunction of thoracic region: Secondary | ICD-10-CM | POA: Diagnosis not present

## 2021-04-11 DIAGNOSIS — M9903 Segmental and somatic dysfunction of lumbar region: Secondary | ICD-10-CM | POA: Diagnosis not present

## 2021-04-11 DIAGNOSIS — M9904 Segmental and somatic dysfunction of sacral region: Secondary | ICD-10-CM | POA: Diagnosis not present

## 2021-04-17 DIAGNOSIS — M419 Scoliosis, unspecified: Secondary | ICD-10-CM | POA: Diagnosis not present

## 2021-04-17 DIAGNOSIS — M545 Low back pain, unspecified: Secondary | ICD-10-CM | POA: Diagnosis not present

## 2021-04-17 DIAGNOSIS — M2560 Stiffness of unspecified joint, not elsewhere classified: Secondary | ICD-10-CM | POA: Diagnosis not present

## 2021-04-17 DIAGNOSIS — R293 Abnormal posture: Secondary | ICD-10-CM | POA: Diagnosis not present

## 2021-04-23 DIAGNOSIS — R293 Abnormal posture: Secondary | ICD-10-CM | POA: Diagnosis not present

## 2021-04-23 DIAGNOSIS — M2560 Stiffness of unspecified joint, not elsewhere classified: Secondary | ICD-10-CM | POA: Diagnosis not present

## 2021-04-23 DIAGNOSIS — M419 Scoliosis, unspecified: Secondary | ICD-10-CM | POA: Diagnosis not present

## 2021-04-23 DIAGNOSIS — M545 Low back pain, unspecified: Secondary | ICD-10-CM | POA: Diagnosis not present

## 2021-05-01 DIAGNOSIS — R293 Abnormal posture: Secondary | ICD-10-CM | POA: Diagnosis not present

## 2021-05-01 DIAGNOSIS — M545 Low back pain, unspecified: Secondary | ICD-10-CM | POA: Diagnosis not present

## 2021-05-01 DIAGNOSIS — M419 Scoliosis, unspecified: Secondary | ICD-10-CM | POA: Diagnosis not present

## 2021-05-01 DIAGNOSIS — M2560 Stiffness of unspecified joint, not elsewhere classified: Secondary | ICD-10-CM | POA: Diagnosis not present

## 2021-05-02 ENCOUNTER — Ambulatory Visit (HOSPITAL_COMMUNITY)
Admission: RE | Admit: 2021-05-02 | Discharge: 2021-05-02 | Disposition: A | Discharge status: Home / Self Care | Payer: Medicare Other | Source: Ambulatory Visit | Attending: Urology | Admitting: Urology

## 2021-05-02 ENCOUNTER — Other Ambulatory Visit: Payer: Self-pay

## 2021-05-02 DIAGNOSIS — N1330 Unspecified hydronephrosis: Secondary | ICD-10-CM | POA: Diagnosis not present

## 2021-05-02 DIAGNOSIS — Q6211 Congenital occlusion of ureteropelvic junction: Secondary | ICD-10-CM | POA: Diagnosis not present

## 2021-05-08 DIAGNOSIS — M5137 Other intervertebral disc degeneration, lumbosacral region: Secondary | ICD-10-CM | POA: Diagnosis not present

## 2021-05-08 DIAGNOSIS — M5441 Lumbago with sciatica, right side: Secondary | ICD-10-CM | POA: Diagnosis not present

## 2021-05-08 DIAGNOSIS — M5136 Other intervertebral disc degeneration, lumbar region: Secondary | ICD-10-CM | POA: Diagnosis not present

## 2021-05-08 DIAGNOSIS — M546 Pain in thoracic spine: Secondary | ICD-10-CM | POA: Diagnosis not present

## 2021-05-08 DIAGNOSIS — M9902 Segmental and somatic dysfunction of thoracic region: Secondary | ICD-10-CM | POA: Diagnosis not present

## 2021-05-08 DIAGNOSIS — M9904 Segmental and somatic dysfunction of sacral region: Secondary | ICD-10-CM | POA: Diagnosis not present

## 2021-05-08 DIAGNOSIS — M9903 Segmental and somatic dysfunction of lumbar region: Secondary | ICD-10-CM | POA: Diagnosis not present

## 2021-05-09 DIAGNOSIS — R293 Abnormal posture: Secondary | ICD-10-CM | POA: Diagnosis not present

## 2021-05-09 DIAGNOSIS — M2560 Stiffness of unspecified joint, not elsewhere classified: Secondary | ICD-10-CM | POA: Diagnosis not present

## 2021-05-09 DIAGNOSIS — M419 Scoliosis, unspecified: Secondary | ICD-10-CM | POA: Diagnosis not present

## 2021-05-09 DIAGNOSIS — M545 Low back pain, unspecified: Secondary | ICD-10-CM | POA: Diagnosis not present

## 2021-05-22 DIAGNOSIS — M419 Scoliosis, unspecified: Secondary | ICD-10-CM | POA: Diagnosis not present

## 2021-05-22 DIAGNOSIS — M2560 Stiffness of unspecified joint, not elsewhere classified: Secondary | ICD-10-CM | POA: Diagnosis not present

## 2021-05-22 DIAGNOSIS — R293 Abnormal posture: Secondary | ICD-10-CM | POA: Diagnosis not present

## 2021-05-22 DIAGNOSIS — M545 Low back pain, unspecified: Secondary | ICD-10-CM | POA: Diagnosis not present

## 2021-06-05 DIAGNOSIS — M5137 Other intervertebral disc degeneration, lumbosacral region: Secondary | ICD-10-CM | POA: Diagnosis not present

## 2021-06-05 DIAGNOSIS — M546 Pain in thoracic spine: Secondary | ICD-10-CM | POA: Diagnosis not present

## 2021-06-05 DIAGNOSIS — M5136 Other intervertebral disc degeneration, lumbar region: Secondary | ICD-10-CM | POA: Diagnosis not present

## 2021-06-05 DIAGNOSIS — M9903 Segmental and somatic dysfunction of lumbar region: Secondary | ICD-10-CM | POA: Diagnosis not present

## 2021-06-05 DIAGNOSIS — M9902 Segmental and somatic dysfunction of thoracic region: Secondary | ICD-10-CM | POA: Diagnosis not present

## 2021-06-05 DIAGNOSIS — M9904 Segmental and somatic dysfunction of sacral region: Secondary | ICD-10-CM | POA: Diagnosis not present

## 2021-06-05 DIAGNOSIS — M5441 Lumbago with sciatica, right side: Secondary | ICD-10-CM | POA: Diagnosis not present

## 2021-07-10 DIAGNOSIS — M546 Pain in thoracic spine: Secondary | ICD-10-CM | POA: Diagnosis not present

## 2021-07-10 DIAGNOSIS — M9902 Segmental and somatic dysfunction of thoracic region: Secondary | ICD-10-CM | POA: Diagnosis not present

## 2021-07-10 DIAGNOSIS — M5137 Other intervertebral disc degeneration, lumbosacral region: Secondary | ICD-10-CM | POA: Diagnosis not present

## 2021-07-10 DIAGNOSIS — M5136 Other intervertebral disc degeneration, lumbar region: Secondary | ICD-10-CM | POA: Diagnosis not present

## 2021-07-10 DIAGNOSIS — M9903 Segmental and somatic dysfunction of lumbar region: Secondary | ICD-10-CM | POA: Diagnosis not present

## 2021-07-10 DIAGNOSIS — M9904 Segmental and somatic dysfunction of sacral region: Secondary | ICD-10-CM | POA: Diagnosis not present

## 2021-07-10 DIAGNOSIS — M5441 Lumbago with sciatica, right side: Secondary | ICD-10-CM | POA: Diagnosis not present

## 2021-08-14 DIAGNOSIS — M9903 Segmental and somatic dysfunction of lumbar region: Secondary | ICD-10-CM | POA: Diagnosis not present

## 2021-08-14 DIAGNOSIS — M5136 Other intervertebral disc degeneration, lumbar region: Secondary | ICD-10-CM | POA: Diagnosis not present

## 2021-08-14 DIAGNOSIS — M546 Pain in thoracic spine: Secondary | ICD-10-CM | POA: Diagnosis not present

## 2021-08-14 DIAGNOSIS — M9904 Segmental and somatic dysfunction of sacral region: Secondary | ICD-10-CM | POA: Diagnosis not present

## 2021-08-14 DIAGNOSIS — M9902 Segmental and somatic dysfunction of thoracic region: Secondary | ICD-10-CM | POA: Diagnosis not present

## 2021-08-14 DIAGNOSIS — M5137 Other intervertebral disc degeneration, lumbosacral region: Secondary | ICD-10-CM | POA: Diagnosis not present

## 2021-08-14 DIAGNOSIS — M5441 Lumbago with sciatica, right side: Secondary | ICD-10-CM | POA: Diagnosis not present

## 2021-08-28 DIAGNOSIS — Z6825 Body mass index (BMI) 25.0-25.9, adult: Secondary | ICD-10-CM | POA: Diagnosis not present

## 2021-08-28 DIAGNOSIS — G243 Spasmodic torticollis: Secondary | ICD-10-CM | POA: Diagnosis not present

## 2021-08-28 DIAGNOSIS — N1831 Chronic kidney disease, stage 3a: Secondary | ICD-10-CM | POA: Diagnosis not present

## 2021-08-28 DIAGNOSIS — I471 Supraventricular tachycardia: Secondary | ICD-10-CM | POA: Diagnosis not present

## 2021-08-28 DIAGNOSIS — Q6211 Congenital occlusion of ureteropelvic junction: Secondary | ICD-10-CM | POA: Diagnosis not present

## 2021-08-28 DIAGNOSIS — H3530 Unspecified macular degeneration: Secondary | ICD-10-CM | POA: Diagnosis not present

## 2021-08-28 DIAGNOSIS — M810 Age-related osteoporosis without current pathological fracture: Secondary | ICD-10-CM | POA: Diagnosis not present

## 2021-08-28 DIAGNOSIS — Z0000 Encounter for general adult medical examination without abnormal findings: Secondary | ICD-10-CM | POA: Diagnosis not present

## 2021-08-28 DIAGNOSIS — Z1331 Encounter for screening for depression: Secondary | ICD-10-CM | POA: Diagnosis not present

## 2021-08-28 DIAGNOSIS — G252 Other specified forms of tremor: Secondary | ICD-10-CM | POA: Diagnosis not present

## 2021-08-28 DIAGNOSIS — M419 Scoliosis, unspecified: Secondary | ICD-10-CM | POA: Diagnosis not present

## 2021-09-03 DIAGNOSIS — N183 Chronic kidney disease, stage 3 unspecified: Secondary | ICD-10-CM | POA: Diagnosis not present

## 2021-09-10 DIAGNOSIS — Q6211 Congenital occlusion of ureteropelvic junction: Secondary | ICD-10-CM | POA: Diagnosis not present

## 2021-09-10 DIAGNOSIS — N183 Chronic kidney disease, stage 3 unspecified: Secondary | ICD-10-CM | POA: Diagnosis not present

## 2021-09-11 DIAGNOSIS — M9904 Segmental and somatic dysfunction of sacral region: Secondary | ICD-10-CM | POA: Diagnosis not present

## 2021-09-11 DIAGNOSIS — M5136 Other intervertebral disc degeneration, lumbar region: Secondary | ICD-10-CM | POA: Diagnosis not present

## 2021-09-11 DIAGNOSIS — M9903 Segmental and somatic dysfunction of lumbar region: Secondary | ICD-10-CM | POA: Diagnosis not present

## 2021-09-11 DIAGNOSIS — M5137 Other intervertebral disc degeneration, lumbosacral region: Secondary | ICD-10-CM | POA: Diagnosis not present

## 2021-09-11 DIAGNOSIS — M546 Pain in thoracic spine: Secondary | ICD-10-CM | POA: Diagnosis not present

## 2021-09-11 DIAGNOSIS — M5441 Lumbago with sciatica, right side: Secondary | ICD-10-CM | POA: Diagnosis not present

## 2021-09-11 DIAGNOSIS — M9902 Segmental and somatic dysfunction of thoracic region: Secondary | ICD-10-CM | POA: Diagnosis not present

## 2021-10-01 DIAGNOSIS — Z1231 Encounter for screening mammogram for malignant neoplasm of breast: Secondary | ICD-10-CM | POA: Diagnosis not present

## 2021-10-10 DIAGNOSIS — M9902 Segmental and somatic dysfunction of thoracic region: Secondary | ICD-10-CM | POA: Diagnosis not present

## 2021-10-10 DIAGNOSIS — M5137 Other intervertebral disc degeneration, lumbosacral region: Secondary | ICD-10-CM | POA: Diagnosis not present

## 2021-10-10 DIAGNOSIS — M546 Pain in thoracic spine: Secondary | ICD-10-CM | POA: Diagnosis not present

## 2021-10-10 DIAGNOSIS — M9903 Segmental and somatic dysfunction of lumbar region: Secondary | ICD-10-CM | POA: Diagnosis not present

## 2021-10-10 DIAGNOSIS — M5136 Other intervertebral disc degeneration, lumbar region: Secondary | ICD-10-CM | POA: Diagnosis not present

## 2021-10-10 DIAGNOSIS — M5441 Lumbago with sciatica, right side: Secondary | ICD-10-CM | POA: Diagnosis not present

## 2021-10-10 DIAGNOSIS — M9904 Segmental and somatic dysfunction of sacral region: Secondary | ICD-10-CM | POA: Diagnosis not present

## 2021-10-21 DIAGNOSIS — R109 Unspecified abdominal pain: Secondary | ICD-10-CM | POA: Diagnosis not present

## 2021-10-29 DIAGNOSIS — H25813 Combined forms of age-related cataract, bilateral: Secondary | ICD-10-CM | POA: Diagnosis not present

## 2021-10-29 DIAGNOSIS — D3131 Benign neoplasm of right choroid: Secondary | ICD-10-CM | POA: Diagnosis not present

## 2021-10-29 DIAGNOSIS — H524 Presbyopia: Secondary | ICD-10-CM | POA: Diagnosis not present

## 2021-10-29 DIAGNOSIS — H353132 Nonexudative age-related macular degeneration, bilateral, intermediate dry stage: Secondary | ICD-10-CM | POA: Diagnosis not present

## 2021-11-13 DIAGNOSIS — M9902 Segmental and somatic dysfunction of thoracic region: Secondary | ICD-10-CM | POA: Diagnosis not present

## 2021-11-13 DIAGNOSIS — M5441 Lumbago with sciatica, right side: Secondary | ICD-10-CM | POA: Diagnosis not present

## 2021-11-13 DIAGNOSIS — M546 Pain in thoracic spine: Secondary | ICD-10-CM | POA: Diagnosis not present

## 2021-11-13 DIAGNOSIS — M5136 Other intervertebral disc degeneration, lumbar region: Secondary | ICD-10-CM | POA: Diagnosis not present

## 2021-11-13 DIAGNOSIS — M9904 Segmental and somatic dysfunction of sacral region: Secondary | ICD-10-CM | POA: Diagnosis not present

## 2021-11-13 DIAGNOSIS — M9903 Segmental and somatic dysfunction of lumbar region: Secondary | ICD-10-CM | POA: Diagnosis not present

## 2021-11-13 DIAGNOSIS — M5137 Other intervertebral disc degeneration, lumbosacral region: Secondary | ICD-10-CM | POA: Diagnosis not present

## 2021-11-22 DIAGNOSIS — R109 Unspecified abdominal pain: Secondary | ICD-10-CM | POA: Diagnosis not present

## 2021-11-22 DIAGNOSIS — M47816 Spondylosis without myelopathy or radiculopathy, lumbar region: Secondary | ICD-10-CM | POA: Diagnosis not present

## 2021-11-22 DIAGNOSIS — K3189 Other diseases of stomach and duodenum: Secondary | ICD-10-CM | POA: Diagnosis not present

## 2021-11-22 DIAGNOSIS — K5730 Diverticulosis of large intestine without perforation or abscess without bleeding: Secondary | ICD-10-CM | POA: Diagnosis not present

## 2021-11-22 DIAGNOSIS — K429 Umbilical hernia without obstruction or gangrene: Secondary | ICD-10-CM | POA: Diagnosis not present

## 2021-12-25 DIAGNOSIS — M9902 Segmental and somatic dysfunction of thoracic region: Secondary | ICD-10-CM | POA: Diagnosis not present

## 2021-12-25 DIAGNOSIS — M5136 Other intervertebral disc degeneration, lumbar region: Secondary | ICD-10-CM | POA: Diagnosis not present

## 2021-12-25 DIAGNOSIS — M5441 Lumbago with sciatica, right side: Secondary | ICD-10-CM | POA: Diagnosis not present

## 2021-12-25 DIAGNOSIS — M9904 Segmental and somatic dysfunction of sacral region: Secondary | ICD-10-CM | POA: Diagnosis not present

## 2021-12-25 DIAGNOSIS — M9903 Segmental and somatic dysfunction of lumbar region: Secondary | ICD-10-CM | POA: Diagnosis not present

## 2021-12-25 DIAGNOSIS — M546 Pain in thoracic spine: Secondary | ICD-10-CM | POA: Diagnosis not present

## 2021-12-25 DIAGNOSIS — M5137 Other intervertebral disc degeneration, lumbosacral region: Secondary | ICD-10-CM | POA: Diagnosis not present

## 2022-02-05 DIAGNOSIS — M5441 Lumbago with sciatica, right side: Secondary | ICD-10-CM | POA: Diagnosis not present

## 2022-02-05 DIAGNOSIS — M5137 Other intervertebral disc degeneration, lumbosacral region: Secondary | ICD-10-CM | POA: Diagnosis not present

## 2022-02-05 DIAGNOSIS — M546 Pain in thoracic spine: Secondary | ICD-10-CM | POA: Diagnosis not present

## 2022-02-05 DIAGNOSIS — M9904 Segmental and somatic dysfunction of sacral region: Secondary | ICD-10-CM | POA: Diagnosis not present

## 2022-02-05 DIAGNOSIS — M9902 Segmental and somatic dysfunction of thoracic region: Secondary | ICD-10-CM | POA: Diagnosis not present

## 2022-02-05 DIAGNOSIS — M5136 Other intervertebral disc degeneration, lumbar region: Secondary | ICD-10-CM | POA: Diagnosis not present

## 2022-02-05 DIAGNOSIS — M9903 Segmental and somatic dysfunction of lumbar region: Secondary | ICD-10-CM | POA: Diagnosis not present

## 2022-02-24 DIAGNOSIS — R109 Unspecified abdominal pain: Secondary | ICD-10-CM | POA: Diagnosis not present

## 2022-03-04 DIAGNOSIS — R1032 Left lower quadrant pain: Secondary | ICD-10-CM | POA: Diagnosis not present

## 2022-03-04 DIAGNOSIS — K439 Ventral hernia without obstruction or gangrene: Secondary | ICD-10-CM | POA: Diagnosis not present

## 2022-03-12 DIAGNOSIS — M9903 Segmental and somatic dysfunction of lumbar region: Secondary | ICD-10-CM | POA: Diagnosis not present

## 2022-03-12 DIAGNOSIS — M5136 Other intervertebral disc degeneration, lumbar region: Secondary | ICD-10-CM | POA: Diagnosis not present

## 2022-03-12 DIAGNOSIS — M546 Pain in thoracic spine: Secondary | ICD-10-CM | POA: Diagnosis not present

## 2022-03-12 DIAGNOSIS — M5137 Other intervertebral disc degeneration, lumbosacral region: Secondary | ICD-10-CM | POA: Diagnosis not present

## 2022-03-12 DIAGNOSIS — M9902 Segmental and somatic dysfunction of thoracic region: Secondary | ICD-10-CM | POA: Diagnosis not present

## 2022-03-12 DIAGNOSIS — M9904 Segmental and somatic dysfunction of sacral region: Secondary | ICD-10-CM | POA: Diagnosis not present

## 2022-03-12 DIAGNOSIS — M5441 Lumbago with sciatica, right side: Secondary | ICD-10-CM | POA: Diagnosis not present

## 2022-04-02 DIAGNOSIS — M9903 Segmental and somatic dysfunction of lumbar region: Secondary | ICD-10-CM | POA: Diagnosis not present

## 2022-04-02 DIAGNOSIS — M9902 Segmental and somatic dysfunction of thoracic region: Secondary | ICD-10-CM | POA: Diagnosis not present

## 2022-04-02 DIAGNOSIS — M5136 Other intervertebral disc degeneration, lumbar region: Secondary | ICD-10-CM | POA: Diagnosis not present

## 2022-04-02 DIAGNOSIS — M546 Pain in thoracic spine: Secondary | ICD-10-CM | POA: Diagnosis not present

## 2022-04-02 DIAGNOSIS — M5441 Lumbago with sciatica, right side: Secondary | ICD-10-CM | POA: Diagnosis not present

## 2022-04-02 DIAGNOSIS — M5137 Other intervertebral disc degeneration, lumbosacral region: Secondary | ICD-10-CM | POA: Diagnosis not present

## 2022-04-02 DIAGNOSIS — M9904 Segmental and somatic dysfunction of sacral region: Secondary | ICD-10-CM | POA: Diagnosis not present

## 2022-05-07 DIAGNOSIS — M5441 Lumbago with sciatica, right side: Secondary | ICD-10-CM | POA: Diagnosis not present

## 2022-05-07 DIAGNOSIS — M5136 Other intervertebral disc degeneration, lumbar region: Secondary | ICD-10-CM | POA: Diagnosis not present

## 2022-05-07 DIAGNOSIS — M546 Pain in thoracic spine: Secondary | ICD-10-CM | POA: Diagnosis not present

## 2022-05-07 DIAGNOSIS — M9903 Segmental and somatic dysfunction of lumbar region: Secondary | ICD-10-CM | POA: Diagnosis not present

## 2022-05-07 DIAGNOSIS — M9904 Segmental and somatic dysfunction of sacral region: Secondary | ICD-10-CM | POA: Diagnosis not present

## 2022-05-07 DIAGNOSIS — M9902 Segmental and somatic dysfunction of thoracic region: Secondary | ICD-10-CM | POA: Diagnosis not present

## 2022-05-07 DIAGNOSIS — M5137 Other intervertebral disc degeneration, lumbosacral region: Secondary | ICD-10-CM | POA: Diagnosis not present

## 2022-06-12 DIAGNOSIS — M9904 Segmental and somatic dysfunction of sacral region: Secondary | ICD-10-CM | POA: Diagnosis not present

## 2022-06-12 DIAGNOSIS — M546 Pain in thoracic spine: Secondary | ICD-10-CM | POA: Diagnosis not present

## 2022-06-12 DIAGNOSIS — M9903 Segmental and somatic dysfunction of lumbar region: Secondary | ICD-10-CM | POA: Diagnosis not present

## 2022-06-12 DIAGNOSIS — M5441 Lumbago with sciatica, right side: Secondary | ICD-10-CM | POA: Diagnosis not present

## 2022-06-12 DIAGNOSIS — M5137 Other intervertebral disc degeneration, lumbosacral region: Secondary | ICD-10-CM | POA: Diagnosis not present

## 2022-06-12 DIAGNOSIS — M9902 Segmental and somatic dysfunction of thoracic region: Secondary | ICD-10-CM | POA: Diagnosis not present

## 2022-06-12 DIAGNOSIS — M5136 Other intervertebral disc degeneration, lumbar region: Secondary | ICD-10-CM | POA: Diagnosis not present

## 2022-06-25 DIAGNOSIS — K432 Incisional hernia without obstruction or gangrene: Secondary | ICD-10-CM | POA: Diagnosis not present

## 2022-07-09 DIAGNOSIS — M9902 Segmental and somatic dysfunction of thoracic region: Secondary | ICD-10-CM | POA: Diagnosis not present

## 2022-07-09 DIAGNOSIS — M5441 Lumbago with sciatica, right side: Secondary | ICD-10-CM | POA: Diagnosis not present

## 2022-07-09 DIAGNOSIS — M9904 Segmental and somatic dysfunction of sacral region: Secondary | ICD-10-CM | POA: Diagnosis not present

## 2022-07-09 DIAGNOSIS — M5136 Other intervertebral disc degeneration, lumbar region: Secondary | ICD-10-CM | POA: Diagnosis not present

## 2022-07-09 DIAGNOSIS — M5137 Other intervertebral disc degeneration, lumbosacral region: Secondary | ICD-10-CM | POA: Diagnosis not present

## 2022-07-09 DIAGNOSIS — M9903 Segmental and somatic dysfunction of lumbar region: Secondary | ICD-10-CM | POA: Diagnosis not present

## 2022-07-09 DIAGNOSIS — M546 Pain in thoracic spine: Secondary | ICD-10-CM | POA: Diagnosis not present

## 2022-07-29 ENCOUNTER — Other Ambulatory Visit (HOSPITAL_COMMUNITY): Payer: Self-pay | Admitting: Urology

## 2022-07-29 ENCOUNTER — Other Ambulatory Visit: Payer: Self-pay | Admitting: Urology

## 2022-07-29 DIAGNOSIS — Q6211 Congenital occlusion of ureteropelvic junction: Secondary | ICD-10-CM

## 2022-07-29 DIAGNOSIS — N183 Chronic kidney disease, stage 3 unspecified: Secondary | ICD-10-CM

## 2022-08-06 DIAGNOSIS — M5441 Lumbago with sciatica, right side: Secondary | ICD-10-CM | POA: Diagnosis not present

## 2022-08-06 DIAGNOSIS — M9904 Segmental and somatic dysfunction of sacral region: Secondary | ICD-10-CM | POA: Diagnosis not present

## 2022-08-06 DIAGNOSIS — M9903 Segmental and somatic dysfunction of lumbar region: Secondary | ICD-10-CM | POA: Diagnosis not present

## 2022-08-06 DIAGNOSIS — M5137 Other intervertebral disc degeneration, lumbosacral region: Secondary | ICD-10-CM | POA: Diagnosis not present

## 2022-08-06 DIAGNOSIS — M546 Pain in thoracic spine: Secondary | ICD-10-CM | POA: Diagnosis not present

## 2022-08-06 DIAGNOSIS — M5136 Other intervertebral disc degeneration, lumbar region: Secondary | ICD-10-CM | POA: Diagnosis not present

## 2022-08-06 DIAGNOSIS — M9902 Segmental and somatic dysfunction of thoracic region: Secondary | ICD-10-CM | POA: Diagnosis not present

## 2022-08-19 DIAGNOSIS — K432 Incisional hernia without obstruction or gangrene: Secondary | ICD-10-CM | POA: Diagnosis not present

## 2022-08-27 DIAGNOSIS — I471 Supraventricular tachycardia: Secondary | ICD-10-CM | POA: Diagnosis not present

## 2022-08-27 DIAGNOSIS — Z1331 Encounter for screening for depression: Secondary | ICD-10-CM | POA: Diagnosis not present

## 2022-08-27 DIAGNOSIS — G243 Spasmodic torticollis: Secondary | ICD-10-CM | POA: Diagnosis not present

## 2022-08-27 DIAGNOSIS — M810 Age-related osteoporosis without current pathological fracture: Secondary | ICD-10-CM | POA: Diagnosis not present

## 2022-08-27 DIAGNOSIS — Z131 Encounter for screening for diabetes mellitus: Secondary | ICD-10-CM | POA: Diagnosis not present

## 2022-08-27 DIAGNOSIS — R030 Elevated blood-pressure reading, without diagnosis of hypertension: Secondary | ICD-10-CM | POA: Diagnosis not present

## 2022-08-27 DIAGNOSIS — Z0000 Encounter for general adult medical examination without abnormal findings: Secondary | ICD-10-CM | POA: Diagnosis not present

## 2022-08-27 DIAGNOSIS — N1831 Chronic kidney disease, stage 3a: Secondary | ICD-10-CM | POA: Diagnosis not present

## 2022-08-27 DIAGNOSIS — Q6211 Congenital occlusion of ureteropelvic junction: Secondary | ICD-10-CM | POA: Diagnosis not present

## 2022-08-27 DIAGNOSIS — G252 Other specified forms of tremor: Secondary | ICD-10-CM | POA: Diagnosis not present

## 2022-08-27 DIAGNOSIS — Z6824 Body mass index (BMI) 24.0-24.9, adult: Secondary | ICD-10-CM | POA: Diagnosis not present

## 2022-08-27 DIAGNOSIS — K432 Incisional hernia without obstruction or gangrene: Secondary | ICD-10-CM | POA: Diagnosis not present

## 2022-08-27 DIAGNOSIS — M419 Scoliosis, unspecified: Secondary | ICD-10-CM | POA: Diagnosis not present

## 2022-08-28 ENCOUNTER — Ambulatory Visit (HOSPITAL_COMMUNITY)
Admission: RE | Admit: 2022-08-28 | Discharge: 2022-08-28 | Disposition: A | Discharge status: Home / Self Care | Payer: Medicare Other | Source: Ambulatory Visit | Attending: Urology | Admitting: Urology

## 2022-08-28 DIAGNOSIS — N183 Chronic kidney disease, stage 3 unspecified: Secondary | ICD-10-CM | POA: Insufficient documentation

## 2022-08-28 DIAGNOSIS — Q6211 Congenital occlusion of ureteropelvic junction: Secondary | ICD-10-CM | POA: Diagnosis not present

## 2022-08-28 DIAGNOSIS — N1330 Unspecified hydronephrosis: Secondary | ICD-10-CM | POA: Diagnosis not present

## 2022-09-02 DIAGNOSIS — M9903 Segmental and somatic dysfunction of lumbar region: Secondary | ICD-10-CM | POA: Diagnosis not present

## 2022-09-02 DIAGNOSIS — M5441 Lumbago with sciatica, right side: Secondary | ICD-10-CM | POA: Diagnosis not present

## 2022-09-02 DIAGNOSIS — M546 Pain in thoracic spine: Secondary | ICD-10-CM | POA: Diagnosis not present

## 2022-09-02 DIAGNOSIS — Q6211 Congenital occlusion of ureteropelvic junction: Secondary | ICD-10-CM | POA: Diagnosis not present

## 2022-09-02 DIAGNOSIS — M5137 Other intervertebral disc degeneration, lumbosacral region: Secondary | ICD-10-CM | POA: Diagnosis not present

## 2022-09-02 DIAGNOSIS — M9902 Segmental and somatic dysfunction of thoracic region: Secondary | ICD-10-CM | POA: Diagnosis not present

## 2022-09-02 DIAGNOSIS — M9904 Segmental and somatic dysfunction of sacral region: Secondary | ICD-10-CM | POA: Diagnosis not present

## 2022-09-02 DIAGNOSIS — M5136 Other intervertebral disc degeneration, lumbar region: Secondary | ICD-10-CM | POA: Diagnosis not present

## 2022-09-08 DIAGNOSIS — Q6211 Congenital occlusion of ureteropelvic junction: Secondary | ICD-10-CM | POA: Diagnosis not present

## 2022-09-12 ENCOUNTER — Other Ambulatory Visit (HOSPITAL_COMMUNITY): Payer: Self-pay | Admitting: Urology

## 2022-09-12 ENCOUNTER — Other Ambulatory Visit: Payer: Self-pay | Admitting: Urology

## 2022-09-12 DIAGNOSIS — Q6211 Congenital occlusion of ureteropelvic junction: Secondary | ICD-10-CM

## 2022-09-15 DIAGNOSIS — L03011 Cellulitis of right finger: Secondary | ICD-10-CM | POA: Diagnosis not present

## 2022-09-30 ENCOUNTER — Encounter (HOSPITAL_COMMUNITY)
Admission: RE | Admit: 2022-09-30 | Discharge: 2022-09-30 | Disposition: A | Discharge status: Home / Self Care | Payer: Medicare Other | Source: Ambulatory Visit | Attending: Urology | Admitting: Urology

## 2022-09-30 DIAGNOSIS — Q6211 Congenital occlusion of ureteropelvic junction: Secondary | ICD-10-CM | POA: Insufficient documentation

## 2022-09-30 MED ORDER — FUROSEMIDE 10 MG/ML IJ SOLN
INTRAMUSCULAR | Status: AC
  Filled 2022-09-30: qty 4

## 2022-09-30 MED ORDER — TECHNETIUM TC 99M MERTIATIDE
5.0000 | Freq: Once | INTRAVENOUS | Status: AC | PRN
  Administered 2022-09-30: 5 via INTRAVENOUS

## 2022-09-30 MED ORDER — FUROSEMIDE 10 MG/ML IJ SOLN
37.0000 mg | Freq: Once | INTRAMUSCULAR | Status: AC
  Administered 2022-09-30: 37 mg via INTRAVENOUS

## 2022-10-08 DIAGNOSIS — M9904 Segmental and somatic dysfunction of sacral region: Secondary | ICD-10-CM | POA: Diagnosis not present

## 2022-10-08 DIAGNOSIS — M5136 Other intervertebral disc degeneration, lumbar region: Secondary | ICD-10-CM | POA: Diagnosis not present

## 2022-10-08 DIAGNOSIS — M9903 Segmental and somatic dysfunction of lumbar region: Secondary | ICD-10-CM | POA: Diagnosis not present

## 2022-10-08 DIAGNOSIS — M546 Pain in thoracic spine: Secondary | ICD-10-CM | POA: Diagnosis not present

## 2022-10-08 DIAGNOSIS — M5441 Lumbago with sciatica, right side: Secondary | ICD-10-CM | POA: Diagnosis not present

## 2022-10-08 DIAGNOSIS — M5137 Other intervertebral disc degeneration, lumbosacral region: Secondary | ICD-10-CM | POA: Diagnosis not present

## 2022-10-08 DIAGNOSIS — M9902 Segmental and somatic dysfunction of thoracic region: Secondary | ICD-10-CM | POA: Diagnosis not present

## 2022-10-13 DIAGNOSIS — Z1231 Encounter for screening mammogram for malignant neoplasm of breast: Secondary | ICD-10-CM | POA: Diagnosis not present

## 2022-10-14 ENCOUNTER — Ambulatory Visit: Payer: Self-pay | Admitting: General Surgery

## 2022-10-14 NOTE — Pre-Procedure Instructions (Addendum)
Surgical Instructions    Your procedure is scheduled on Friday November 10.  Report to [facility] Main Entrance "A" at 10:30 A.M., then check in with the Admitting office.  Call this number if you have problems the morning of surgery:  [phone]  If you have any questions prior to your surgery date call [phone]: Open Monday-Friday 8am-4pm  If you experience any cold, Covid, or flu symptoms such as cough, fever, chills, shortness of breath, etc. between now and your scheduled surgery, please notify us at the above number     Remember:  Do not eat after midnight the night before your surgery   Patient Instructions  The night before surgery:    Drink TWO (2) Pre-Surgery Clear Ensure by MIDNIGHT. Drink in one sitting. Do not sip.  This drink was given to you during your hospital pre-op appointment visit.  No food after midnight. ONLY clear liquids after midnight  The day of surgery (if you do NOT have diabetes): You may drink clear liquids until 9:30 the morning of your surgery.    Clear liquids allowed are: Water, Non-Citrus Juices (without pulp), Carbonated Beverages, Clear Tea, Black Coffee ONLY (NO MILK, CREAM OR POWDERED CREAMER of any kind), and Gatorade  Drink ONE (1) Pre-Surgery Clear Ensure by 9:30 the morning of surgery. Drink in one sitting. Do not sip.  This drink was given to you during your hospital pre-op appointment visit.  Nothing else to drink after completing the Pre-Surgery Clear Ensure.          If you have questions, please contact your surgeon's office.     Take these medications the morning of surgery with A SIP OF WATER:  NONE  As of today, STOP taking any Aspirin (unless otherwise instructed by your surgeon) Aleve, Naproxen, Ibuprofen, Motrin, Advil, Goody's, BC's, all herbal medications, fish oil, and all vitamins.          Do NOT Smoke (Tobacco/Vaping)  24 hours prior to your procedure  If you use a CPAP at night, you may bring your mask  for your overnight stay.   Contacts, glasses, hearing aids, dentures or partials may not be worn into surgery, please bring cases for these belongings   For patients admitted to the hospital, discharge time will be determined by your treatment team.   Patients discharged the day of surgery will not be allowed to drive home, and someone needs to stay with them for 24 hours.   SURGICAL WAITING ROOM VISITATION Patients having surgery or a procedure may have no more than 2 support people in the waiting area - these visitors may rotate.   Children under the age of 9 must have an adult with them who is not the patient. If the patient needs to stay at the hospital during part of their recovery, the visitor guidelines for inpatient rooms apply. Pre-op nurse will coordinate an appropriate time for 1 support person to accompany patient in pre-op.  This support person may not rotate.   Please refer to the [facility] website for the visitor guidelines for Inpatients (after your surgery is over and you are in a regular room).    Special instructions:    Oral Hygiene is also important to reduce your risk of infection.  Remember -  BRUSH YOUR TEETH THE MORNING OF SURGERY WITH YOUR REGULAR TOOTHPASTE   [place]- Preparing For Surgery  Before surgery, you can play an important role. Because skin is not sterile, your skin needs to be  as free of germs as possible. You can reduce the number of germs on your skin by washing with CHG (chlorahexidine gluconate) Soap before surgery.  CHG is an antiseptic cleaner which kills germs and bonds with the skin to continue killing germs even after washing.     Please do not use if you have an allergy to CHG or antibacterial soaps. If your skin becomes reddened/irritated stop using the CHG.  Do not shave (including legs and underarms) for at least 48 hours prior to first CHG shower. It is OK to shave your face.  Please follow these instructions  carefully.    Shower the NIGHT BEFORE SURGERY and the MORNING OF SURGERY with CHG Soap.  If you chose to wash your hair, wash your hair first as usual with your normal shampoo.  After you shampoo, rinse your hair and body thoroughly to remove the shampoo.   Then [employer] and genitals (private parts) with your normal soap and rinse thoroughly to remove soap.  After that Use CHG Soap as you would any other liquid soap.  You can apply CHG directly to the skin and wash gently with a scrungie or a clean washcloth.   Apply the CHG Soap to your body ONLY FROM THE NECK DOWN.   Do not use on open wounds or open sores.  Avoid contact with your eyes, ears, mouth and genitals (private parts).  Wash thoroughly, paying special attention to the area where your surgery will be performed.  Thoroughly rinse your body with warm water from the neck down.  DO NOT shower/wash with your normal soap after using and rinsing off the CHG Soap.  Pat yourself dry with a CLEAN TOWEL.  Wear CLEAN PAJAMAS to bed the night before surgery  Place CLEAN SHEETS on your bed the night before your surgery  DO NOT SLEEP WITH PETS.   Day of Surgery:  Take a shower with CHG soap. Wear Clean/Comfortable clothing the morning of surgery Brush your teeth WITH YOUR REGULAR TOOTHPASTE. Do not wear jewelry or makeup. Do not wear lotions, powders, perfumes or deodorant. Do not shave 48 hours prior to surgery. Do not bring valuables to the hospital.  [facility] is not responsible for any belongings or valuables.  Do not wear nail polish, gel polish, artificial nails, or any other type of covering on natural nails (fingers and toes) If you have artificial nails or gel coating that need to be removed by a nail salon, please have this removed prior to surgery. Artificial nails or gel coating may interfere with anesthesia's ability to adequately monitor your vital signs.    If you received a COVID test during your pre-op  visit, it is requested that you wear a mask when out in public, stay away from anyone that may not be feeling well, and notify your surgeon if you develop symptoms. If you have been in contact with anyone that has tested positive in the last 10 days, please notify your surgeon.    Please read over the following fact sheets that you were given.

## 2022-10-15 ENCOUNTER — Other Ambulatory Visit: Payer: Self-pay

## 2022-10-15 ENCOUNTER — Encounter (HOSPITAL_COMMUNITY)
Admission: RE | Admit: 2022-10-15 | Discharge: 2022-10-15 | Disposition: A | Discharge status: Home / Self Care | Payer: Medicare Other | Source: Ambulatory Visit | Attending: General Surgery | Admitting: General Surgery

## 2022-10-15 ENCOUNTER — Encounter (HOSPITAL_COMMUNITY): Payer: Self-pay

## 2022-10-15 VITALS — BP 183/99 | HR 64 | Temp 98.2°F | Resp 18 | Ht 67.0 in | Wt 159.9 lb

## 2022-10-15 DIAGNOSIS — Z01818 Encounter for other preprocedural examination: Secondary | ICD-10-CM | POA: Diagnosis not present

## 2022-10-15 HISTORY — DX: Other specified postprocedural states: Z98.890

## 2022-10-15 HISTORY — DX: Myoneural disorder, unspecified: G70.9

## 2022-10-15 HISTORY — DX: Other specified postprocedural states: R11.2

## 2022-10-15 LAB — CBC
HCT: 40.2 % (ref 36.0–46.0)
Hemoglobin: 13.1 g/dL (ref 12.0–15.0)
MCH: 31.6 pg (ref 26.0–34.0)
MCHC: 32.6 g/dL (ref 30.0–36.0)
MCV: 96.9 fL (ref 80.0–100.0)
Platelets: 183 10*3/uL (ref 150–400)
RBC: 4.15 MIL/uL (ref 3.87–5.11)
RDW: 12.4 % (ref 11.5–15.5)
WBC: 4.9 10*3/uL (ref 4.0–10.5)
nRBC: 0 % (ref 0.0–0.2)

## 2022-10-15 LAB — BASIC METABOLIC PANEL
Anion gap: 10 (ref 5–15)
BUN: 28 mg/dL — ABNORMAL HIGH (ref 8–23)
CO2: 26 mmol/L (ref 22–32)
Calcium: 9.6 mg/dL (ref 8.9–10.3)
Chloride: 104 mmol/L (ref 98–111)
Creatinine, Ser: 1.22 mg/dL — ABNORMAL HIGH (ref 0.44–1.00)
GFR, Estimated: 46 mL/min — ABNORMAL LOW (ref >60)
Glucose, Bld: 107 mg/dL — ABNORMAL HIGH (ref 70–99)
Potassium: 4.6 mmol/L (ref 3.5–5.1)
Sodium: 140 mmol/L (ref 135–145)

## 2022-10-15 NOTE — Progress Notes (Addendum)
PCP - Townsend Roger MD Cardiologist - Agustin Cree  PPM/ICD - denies  Chest x-ray - n/a EKG - 2021 with Agustin Cree Stress Test - in the 1980s- pt doesn't remember why ECHO - 08/08/20 Cardiac Cath - denies  Sleep Study - denies CPAP - n/a  Not diabetic.  As of today, STOP taking any Aspirin (unless otherwise instructed by your surgeon) Aleve, Naproxen, Ibuprofen, Motrin, Advil, Goody's, BC's, all herbal medications, fish oil, and all vitamins.  ERAS Protcol - yes PRE-SURGERY Ensure or G2- ensure x 3 (2 PM before sx, 1 AM of sx)  COVID TEST- n/a  Anesthesia review: no  Pt is hesitant to stop taking vitamin D prior to surgery. This RN spoke with Jeneen Rinks PA who believes it should be okay for her to continue.  Patient denies shortness of breath, fever, cough and chest pain at PAT appointment  All instructions explained to the patient, with a verbal understanding of the material. Patient agrees to go over the instructions while at home for a better understanding. The opportunity to ask questions was provided.

## 2022-10-22 DIAGNOSIS — M546 Pain in thoracic spine: Secondary | ICD-10-CM | POA: Diagnosis not present

## 2022-10-22 DIAGNOSIS — M9902 Segmental and somatic dysfunction of thoracic region: Secondary | ICD-10-CM | POA: Diagnosis not present

## 2022-10-22 DIAGNOSIS — M5441 Lumbago with sciatica, right side: Secondary | ICD-10-CM | POA: Diagnosis not present

## 2022-10-22 DIAGNOSIS — M9904 Segmental and somatic dysfunction of sacral region: Secondary | ICD-10-CM | POA: Diagnosis not present

## 2022-10-22 DIAGNOSIS — M9903 Segmental and somatic dysfunction of lumbar region: Secondary | ICD-10-CM | POA: Diagnosis not present

## 2022-10-22 DIAGNOSIS — M5137 Other intervertebral disc degeneration, lumbosacral region: Secondary | ICD-10-CM | POA: Diagnosis not present

## 2022-10-22 DIAGNOSIS — M5136 Other intervertebral disc degeneration, lumbar region: Secondary | ICD-10-CM | POA: Diagnosis not present

## 2022-10-24 ENCOUNTER — Ambulatory Visit (HOSPITAL_BASED_OUTPATIENT_CLINIC_OR_DEPARTMENT_OTHER): Payer: Medicare Other | Admitting: Anesthesiology

## 2022-10-24 ENCOUNTER — Inpatient Hospital Stay (HOSPITAL_COMMUNITY)
Admission: RE | Admit: 2022-10-24 | Discharge: 2022-10-27 | DRG: 355 | Disposition: A | Discharge status: Home / Self Care | Payer: Medicare Other | Source: Ambulatory Visit | Attending: General Surgery | Admitting: General Surgery

## 2022-10-24 ENCOUNTER — Other Ambulatory Visit: Payer: Self-pay

## 2022-10-24 ENCOUNTER — Encounter (HOSPITAL_COMMUNITY): Payer: Self-pay | Admitting: General Surgery

## 2022-10-24 ENCOUNTER — Ambulatory Visit (HOSPITAL_COMMUNITY): Payer: Medicare Other | Admitting: Physician Assistant

## 2022-10-24 ENCOUNTER — Encounter (HOSPITAL_COMMUNITY)
Admission: RE | Disposition: A | Discharge status: Home / Self Care | Payer: Self-pay | Source: Ambulatory Visit | Attending: General Surgery

## 2022-10-24 DIAGNOSIS — Z905 Acquired absence of kidney: Secondary | ICD-10-CM

## 2022-10-24 DIAGNOSIS — Z833 Family history of diabetes mellitus: Secondary | ICD-10-CM

## 2022-10-24 DIAGNOSIS — Z807 Family history of other malignant neoplasms of lymphoid, hematopoietic and related tissues: Secondary | ICD-10-CM | POA: Diagnosis not present

## 2022-10-24 DIAGNOSIS — N189 Chronic kidney disease, unspecified: Secondary | ICD-10-CM | POA: Diagnosis not present

## 2022-10-24 DIAGNOSIS — M810 Age-related osteoporosis without current pathological fracture: Secondary | ICD-10-CM | POA: Diagnosis not present

## 2022-10-24 DIAGNOSIS — K432 Incisional hernia without obstruction or gangrene: Secondary | ICD-10-CM | POA: Diagnosis not present

## 2022-10-24 DIAGNOSIS — Z8719 Personal history of other diseases of the digestive system: Principal | ICD-10-CM

## 2022-10-24 DIAGNOSIS — I471 Supraventricular tachycardia, unspecified: Secondary | ICD-10-CM

## 2022-10-24 DIAGNOSIS — Z800 Family history of malignant neoplasm of digestive organs: Secondary | ICD-10-CM | POA: Diagnosis not present

## 2022-10-24 DIAGNOSIS — G709 Myoneural disorder, unspecified: Secondary | ICD-10-CM

## 2022-10-24 DIAGNOSIS — Z8249 Family history of ischemic heart disease and other diseases of the circulatory system: Secondary | ICD-10-CM

## 2022-10-24 HISTORY — PX: INCISIONAL HERNIA REPAIR: SHX193

## 2022-10-24 HISTORY — PX: INSERTION OF MESH: SHX5868

## 2022-10-24 SURGERY — REPAIR, HERNIA, INCISIONAL, LAPAROSCOPIC
Anesthesia: General | Site: Abdomen

## 2022-10-24 MED ORDER — FENTANYL CITRATE (PF) 250 MCG/5ML IJ SOLN
INTRAMUSCULAR | Status: DC | PRN
  Administered 2022-10-24: 50 ug via INTRAVENOUS
  Administered 2022-10-24: 25 ug via INTRAVENOUS
  Administered 2022-10-24 (×2): 50 ug via INTRAVENOUS
  Administered 2022-10-24: 25 ug via INTRAVENOUS

## 2022-10-24 MED ORDER — ONDANSETRON 4 MG PO TBDP: 4.0000 mg | ORAL_TABLET | Freq: Four times a day (QID) | ORAL | Status: DC | PRN

## 2022-10-24 MED ORDER — BUPIVACAINE-EPINEPHRINE (PF) 0.25% -1:200000 IJ SOLN
INTRAMUSCULAR | Status: AC
  Filled 2022-10-24: qty 30

## 2022-10-24 MED ORDER — OXYCODONE HCL 5 MG/5ML PO SOLN: 5.0000 mg | Freq: Once | ORAL | Status: DC | PRN

## 2022-10-24 MED ORDER — 0.9 % SODIUM CHLORIDE (POUR BTL) OPTIME
TOPICAL | Status: DC | PRN
  Administered 2022-10-24: 500 mL

## 2022-10-24 MED ORDER — HYDRALAZINE HCL 20 MG/ML IJ SOLN: 10.0000 mg | INTRAMUSCULAR | Status: DC | PRN

## 2022-10-24 MED ORDER — ONDANSETRON HCL 4 MG/2ML IJ SOLN: 4.0000 mg | Freq: Four times a day (QID) | INTRAMUSCULAR | Status: DC | PRN

## 2022-10-24 MED ORDER — CHLORHEXIDINE GLUCONATE 0.12 % MT SOLN
15.0000 mL | Freq: Once | OROMUCOSAL | Status: AC
  Administered 2022-10-24: 15 mL via OROMUCOSAL
  Filled 2022-10-24: qty 15

## 2022-10-24 MED ORDER — ACETAMINOPHEN 500 MG PO TABS: 1000.0000 mg | ORAL_TABLET | ORAL | Status: DC

## 2022-10-24 MED ORDER — BUPIVACAINE-EPINEPHRINE (PF) 0.5% -1:200000 IJ SOLN
INTRAMUSCULAR | Status: DC | PRN
  Administered 2022-10-24: 15 mL

## 2022-10-24 MED ORDER — ENSURE PRE-SURGERY PO LIQD: 592.0000 mL | Freq: Once | ORAL | Status: DC

## 2022-10-24 MED ORDER — FENTANYL CITRATE (PF) 250 MCG/5ML IJ SOLN
INTRAMUSCULAR | Status: AC
  Filled 2022-10-24: qty 5

## 2022-10-24 MED ORDER — LIDOCAINE 2% (20 MG/ML) 5 ML SYRINGE
INTRAMUSCULAR | Status: DC | PRN
  Administered 2022-10-24: 80 mg via INTRAVENOUS

## 2022-10-24 MED ORDER — CEFAZOLIN SODIUM-DEXTROSE 2-4 GM/100ML-% IV SOLN
2.0000 g | INTRAVENOUS | Status: AC
  Administered 2022-10-24: 2 g via INTRAVENOUS
  Filled 2022-10-24: qty 100

## 2022-10-24 MED ORDER — ACETAMINOPHEN 500 MG PO TABS
1000.0000 mg | ORAL_TABLET | Freq: Once | ORAL | Status: AC
  Administered 2022-10-24: 1000 mg via ORAL
  Filled 2022-10-24: qty 2

## 2022-10-24 MED ORDER — ROCURONIUM BROMIDE 10 MG/ML (PF) SYRINGE
PREFILLED_SYRINGE | INTRAVENOUS | Status: DC | PRN
  Administered 2022-10-24: 40 mg via INTRAVENOUS

## 2022-10-24 MED ORDER — LACTATED RINGERS IV SOLN: INTRAVENOUS | Status: DC

## 2022-10-24 MED ORDER — DEXAMETHASONE SODIUM PHOSPHATE 10 MG/ML IJ SOLN
INTRAMUSCULAR | Status: DC | PRN
  Administered 2022-10-24: 10 mg via INTRAVENOUS

## 2022-10-24 MED ORDER — POTASSIUM CHLORIDE IN NACL 20-0.9 MEQ/L-% IV SOLN
INTRAVENOUS | Status: DC
  Filled 2022-10-24 (×4): qty 1000

## 2022-10-24 MED ORDER — SUGAMMADEX SODIUM 200 MG/2ML IV SOLN
INTRAVENOUS | Status: DC | PRN
  Administered 2022-10-24: 300 mg via INTRAVENOUS

## 2022-10-24 MED ORDER — OXYCODONE HCL 5 MG PO TABS: 5.0000 mg | ORAL_TABLET | Freq: Once | ORAL | Status: DC | PRN

## 2022-10-24 MED ORDER — DIPHENHYDRAMINE HCL 12.5 MG/5ML PO ELIX: 12.5000 mg | ORAL_SOLUTION | Freq: Four times a day (QID) | ORAL | Status: DC | PRN

## 2022-10-24 MED ORDER — FENTANYL CITRATE (PF) 100 MCG/2ML IJ SOLN
INTRAMUSCULAR | Status: AC
  Filled 2022-10-24: qty 2

## 2022-10-24 MED ORDER — PROPOFOL 10 MG/ML IV BOLUS
INTRAVENOUS | Status: DC | PRN
  Administered 2022-10-24: 100 mg via INTRAVENOUS

## 2022-10-24 MED ORDER — CHLORHEXIDINE GLUCONATE CLOTH 2 % EX PADS: 6.0000 | MEDICATED_PAD | Freq: Once | CUTANEOUS | Status: DC

## 2022-10-24 MED ORDER — ENSURE PRE-SURGERY PO LIQD: 296.0000 mL | Freq: Once | ORAL | Status: DC

## 2022-10-24 MED ORDER — ORAL CARE MOUTH RINSE: 15.0000 mL | Freq: Once | OROMUCOSAL | Status: AC

## 2022-10-24 MED ORDER — HYDROCODONE-ACETAMINOPHEN 5-325 MG PO TABS: 1.0000 | ORAL_TABLET | ORAL | Status: DC | PRN

## 2022-10-24 MED ORDER — ESMOLOL HCL 100 MG/10ML IV SOLN
INTRAVENOUS | Status: DC | PRN
  Administered 2022-10-24: 20 mg via INTRAVENOUS

## 2022-10-24 MED ORDER — FENTANYL CITRATE (PF) 100 MCG/2ML IJ SOLN
25.0000 ug | INTRAMUSCULAR | Status: DC | PRN
  Administered 2022-10-24: 50 ug via INTRAVENOUS

## 2022-10-24 MED ORDER — ENOXAPARIN SODIUM 40 MG/0.4ML IJ SOSY
40.0000 mg | PREFILLED_SYRINGE | INTRAMUSCULAR | Status: DC
  Administered 2022-10-25 – 2022-10-26 (×2): 40 mg via SUBCUTANEOUS
  Filled 2022-10-24 (×2): qty 0.4

## 2022-10-24 MED ORDER — DIPHENHYDRAMINE HCL 50 MG/ML IJ SOLN: 12.5000 mg | Freq: Four times a day (QID) | INTRAMUSCULAR | Status: DC | PRN

## 2022-10-24 MED ORDER — MORPHINE SULFATE (PF) 4 MG/ML IV SOLN
4.0000 mg | INTRAVENOUS | Status: DC | PRN
  Administered 2022-10-24 – 2022-10-25 (×3): 4 mg via INTRAVENOUS
  Filled 2022-10-24 (×3): qty 1

## 2022-10-24 MED ORDER — ONDANSETRON HCL 4 MG/2ML IJ SOLN
INTRAMUSCULAR | Status: DC | PRN
  Administered 2022-10-24: 4 mg via INTRAVENOUS

## 2022-10-24 MED ORDER — PROMETHAZINE HCL 25 MG/ML IJ SOLN: 6.2500 mg | INTRAMUSCULAR | Status: DC | PRN

## 2022-10-24 MED ORDER — BUPIVACAINE-EPINEPHRINE (PF) 0.5% -1:200000 IJ SOLN
INTRAMUSCULAR | Status: AC
  Filled 2022-10-24: qty 30

## 2022-10-24 MED ORDER — METHOCARBAMOL 500 MG PO TABS: 500.0000 mg | ORAL_TABLET | Freq: Four times a day (QID) | ORAL | Status: DC

## 2022-10-24 SURGICAL SUPPLY — 40 items
BAG COUNTER SPONGE SURGICOUNT (BAG) ×2 IMPLANT
BINDER ABDOMINAL 12 ML 46-62 (SOFTGOODS) IMPLANT
BLADE CLIPPER SURG (BLADE) IMPLANT
CANISTER SUCT 3000ML PPV (MISCELLANEOUS) IMPLANT
CHLORAPREP W/TINT 26 (MISCELLANEOUS) ×2 IMPLANT
COVER SURGICAL LIGHT HANDLE (MISCELLANEOUS) ×2 IMPLANT
DERMABOND ADVANCED .7 DNX12 (GAUZE/BANDAGES/DRESSINGS) ×2 IMPLANT
DEVICE SECURE STRAP 25 ABSORB (INSTRUMENTS) ×2 IMPLANT
ELECT REM PT RETURN 9FT ADLT (ELECTROSURGICAL) ×2
ELECTRODE REM PT RTRN 9FT ADLT (ELECTROSURGICAL) ×2 IMPLANT
GLOVE BIO SURGEON STRL SZ8 (GLOVE) ×2 IMPLANT
GLOVE BIOGEL PI IND STRL 8 (GLOVE) ×2 IMPLANT
GOWN STRL REUS W/ TWL LRG LVL3 (GOWN DISPOSABLE) ×4 IMPLANT
GOWN STRL REUS W/ TWL XL LVL3 (GOWN DISPOSABLE) ×2 IMPLANT
GOWN STRL REUS W/TWL LRG LVL3 (GOWN DISPOSABLE) ×4
GOWN STRL REUS W/TWL XL LVL3 (GOWN DISPOSABLE) ×2
GRASPER SUT TROCAR 14GX15 (MISCELLANEOUS) ×2 IMPLANT
KIT BASIN OR (CUSTOM PROCEDURE TRAY) ×2 IMPLANT
KIT TURNOVER KIT B (KITS) ×2 IMPLANT
MARKER SKIN DUAL TIP RULER LAB (MISCELLANEOUS) ×2 IMPLANT
MESH VENTRALIGHT ST 6IN CRC (Mesh General) IMPLANT
NDL 22X1.5 STRL (OR ONLY) (MISCELLANEOUS) ×2 IMPLANT
NDL SPNL 22GX3.5 QUINCKE BK (NEEDLE) ×2 IMPLANT
NEEDLE 22X1.5 STRL (OR ONLY) (MISCELLANEOUS) ×2 IMPLANT
NEEDLE SPNL 22GX3.5 QUINCKE BK (NEEDLE) ×2 IMPLANT
NS IRRIG 1000ML POUR BTL (IV SOLUTION) ×2 IMPLANT
PAD ARMBOARD 7.5X6 YLW CONV (MISCELLANEOUS) ×4 IMPLANT
SET TUBE SMOKE EVAC HIGH FLOW (TUBING) ×2 IMPLANT
SHEARS HARMONIC ACE PLUS 36CM (ENDOMECHANICALS) IMPLANT
SLEEVE ENDOPATH XCEL 5M (ENDOMECHANICALS) ×2 IMPLANT
SUT PROLENE 0 CT 1 CR/8 (SUTURE) ×2 IMPLANT
SUT VIC AB 4-0 PS2 27 (SUTURE) ×2 IMPLANT
TOWEL GREEN STERILE (TOWEL DISPOSABLE) ×2 IMPLANT
TOWEL GREEN STERILE FF (TOWEL DISPOSABLE) ×2 IMPLANT
TRAY FOLEY W/BAG SLVR 16FR (SET/KITS/TRAYS/PACK) ×2
TRAY FOLEY W/BAG SLVR 16FR ST (SET/KITS/TRAYS/PACK) ×2 IMPLANT
TRAY LAPAROSCOPIC MC (CUSTOM PROCEDURE TRAY) ×2 IMPLANT
TROCAR BALLN 12MMX100 BLUNT (TROCAR) IMPLANT
TROCAR Z-THREAD OPTICAL 5X100M (TROCAR) ×2 IMPLANT
WATER STERILE IRR 1000ML POUR (IV SOLUTION) ×2 IMPLANT
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
## 2022-10-24 NOTE — Transfer of Care (Signed)
Immediate Anesthesia Transfer of Care Note  Patient: Joanne Greene  Procedure(s) Performed: LAPAROSCOPIC INCISIONAL HERNIA REPAIR WITH MESH (Abdomen) INSERTION OF MESH (Left: Abdomen)  Patient Location: PACU  Anesthesia Type:General  Level of Consciousness: awake, alert , and oriented  Airway & Oxygen Therapy: Patient Spontanous Breathing and Patient connected to nasal cannula oxygen  Post-op Assessment: Report given to RN and Post -op Vital signs reviewed and stable  Post vital signs: Reviewed and stable  Last Vitals:  Vitals Value Taken Time  BP 132/84 10/24/22 1330  Temp 36.1 C 10/24/22 1320  Pulse 64 10/24/22 1334  Resp 25 10/24/22 1334  SpO2 100 % 10/24/22 1334  Vitals shown include unvalidated device data.  Last Pain:  Vitals:   10/24/22 1320  TempSrc:   PainSc: 0-No pain         Complications: No notable events documented.

## 2022-10-24 NOTE — Anesthesia Postprocedure Evaluation (Signed)
Anesthesia Post Note  Patient: Joanne Greene  Procedure(s) Performed: LAPAROSCOPIC INCISIONAL HERNIA REPAIR WITH MESH (Abdomen) INSERTION OF MESH (Left: Abdomen)     Patient location during evaluation: PACU Anesthesia Type: General Level of consciousness: awake Pain management: pain level controlled Vital Signs Assessment: post-procedure vital signs reviewed and stable Respiratory status: spontaneous breathing, nonlabored ventilation and respiratory function stable Cardiovascular status: blood pressure returned to baseline and stable Postop Assessment: no apparent nausea or vomiting Anesthetic complications: no   No notable events documented.  Last Vitals:  Vitals:   10/24/22 1345 10/24/22 1400  BP: (!) 149/83 (!) 149/80  Pulse: 65 70  Resp: 12 14  Temp:    SpO2: 98% 93%    Last Pain:  Vitals:   10/24/22 1320  TempSrc:   PainSc: 0-No pain                 Nilda Simmer

## 2022-10-24 NOTE — Anesthesia Procedure Notes (Signed)
Procedure Name: Intubation Date/Time: 10/24/2022 12:05 PM  Performed by: Reeves Dam, CRNAPre-anesthesia Checklist: Patient identified, Patient being monitored, Timeout performed, Emergency Drugs available and Suction available Patient Re-evaluated:Patient Re-evaluated prior to induction Oxygen Delivery Method: Circle system utilized Preoxygenation: Pre-oxygenation with 100% oxygen Induction Type: IV induction Ventilation: Mask ventilation without difficulty Laryngoscope Size: 3 and Miller Grade View: Grade I Tube type: Oral Tube size: 7.0 mm Number of attempts: 1 Airway Equipment and Method: Stylet Placement Confirmation: ETT inserted through vocal cords under direct vision, positive ETCO2 and breath sounds checked- equal and bilateral Secured at: 21 cm Tube secured with: Tape Dental Injury: Teeth and Oropharynx as per pre-operative assessment

## 2022-10-24 NOTE — Op Note (Signed)
  10/24/2022  1:10 PM  PATIENT:  Joanne Greene  76 y.o. female  PRE-OPERATIVE DIAGNOSIS:  INCISIONAL HERNIA  POST-OPERATIVE DIAGNOSIS:  INCISIONAL HERNIA  PROCEDURE:  Procedure(s): LAPAROSCOPIC INCISIONAL HERNIA REPAIR WITH MESH (HERNIA DEFECT 6CM X 6CM) INSERTION OF MESH 15.2 CM VENTRALEX  SURGEON:  Surgeon(s): Georganna Skeans, MD  ASSISTANTS: Celene Squibb, RNFA   ANESTHESIA:   local and general  EBL:  No intake/output data recorded.  BLOOD ADMINISTERED:none  DRAINS: none   SPECIMEN:  No Specimen  DISPOSITION OF SPECIMEN:  N/A  COUNTS:  YES  DICTATION: .Dragon Dictation Procedure detail: Informed consent was obtained.  She received intravenous antibiotics.  She was brought to the operating room and general endotracheal anesthesia was administered by the anesthesia staff.  Her abdomen was prepped and draped in a sterile fashion.  We did a timeout procedure.  I injected local along her upper midline.  I made a small incision and subcutaneous tissues were dissected down revealing the anterior fascia.  This was divided sharply along the midline and the peritoneal cavity was entered under direct vision.  I placed a pursestring 0 Vicryl suture.  I then inserted a Hassan trocar and the abdomen was insufflated in standard fashion.  Laparoscopic exploration revealed the hernia defect in the left lower quadrant with all contents spontaneously reduced.  The size of the defect was about 6 cm x 6 cm.  The surrounding bowel and omentum appeared normal.  There were no significant adhesions.  I selected a 15.2 cm mesh to complete the hernia repair.  I placed a 0 Prolene in the center of the mesh and then for 0 Prolene sutures spaced regularly along the perimeter.  I then made a small incision at the center of the defect and used a PMI to pull up the mesh and center it.  I then made a small incision x4 and using the PMI for separate stab wounds for each branch of the suture brought up the 4  pairs of suture separately.  The mesh was then pulled up to the abdominal wall and I tied all of the perimeter sutures.  The central suture was cut.  I then used a secure strap to place 2 concentric circles of tacks securing the mesh well against the abdominal wall.  It laid nicely.  There was no significant bleeding.  The abdomen was explored in 4 quadrants and there were no complicating features.  Ports were removed under direct vision.  Pneumoperitoneum was released.  The fascia at the [facility] port site was closed by tying the pursestring.  All 3 wounds were irrigated and the skin of each was closed with 4-0 Vicryl followed by Dermabond.  The small stab wounds for the mesh sutures were also closed with Dermabond.  All counts were correct.  She tolerated the procedure well without apparent complication.  An abdominal binder was placed and she was taken recovery in stable condition. PATIENT DISPOSITION:  PACU - hemodynamically stable.   Delay start of Pharmacological VTE agent (>24hrs) due to surgical blood loss or risk of bleeding:  no  Georganna Skeans, MD, MPH, FACS Pager: [phone]  11/10/20231:10 PM

## 2022-10-24 NOTE — Plan of Care (Signed)
  Problem: Education: Goal: Knowledge of General Education information will improve Description Including pain rating scale, medication(s)/side effects and non-pharmacologic comfort measures Outcome: Progressing   Problem: Health Behavior/Discharge Planning: Goal: Ability to manage health-related needs will improve Outcome: Progressing   

## 2022-10-24 NOTE — Anesthesia Preprocedure Evaluation (Addendum)
Anesthesia Evaluation  Patient identified by MRN, date of birth, ID band Patient awake    Reviewed: Allergy & Precautions, NPO status , Patient's Chart, lab work & pertinent test results  History of Anesthesia Complications (+) PONV and history of anesthetic complications  Airway Mallampati: III  TM Distance: >3 FB Neck ROM: Limited    Dental no notable dental hx.    Pulmonary neg pulmonary ROS   Pulmonary exam normal breath sounds clear to auscultation       Cardiovascular Exercise Tolerance: Good (-) hypertension(-) angina (-) Past MI, (-) Cardiac Stents and (-) CABG + dysrhythmias (monitor did not show significant arrhythmia, patient declined beta blocker) Supra Ventricular Tachycardia  Rhythm:Regular Rate:Normal  TTE 08/08/2020: IMPRESSIONS     1. Left ventricular ejection fraction, by estimation, is 60 to 65%. The  left ventricle has normal function. The left ventricle has no regional  wall motion abnormalities. There is mild concentric left ventricular  hypertrophy. Left ventricular diastolic  parameters are consistent with Grade I diastolic dysfunction (impaired  relaxation).   2. Right ventricular systolic function is normal. The right ventricular  size is normal. There is normal pulmonary artery systolic pressure.   3. The mitral valve is normal in structure. No evidence of mitral valve  regurgitation. No evidence of mitral stenosis.   4. The aortic valve is tricuspid. Aortic valve regurgitation is not  visualized. No aortic stenosis is present.   5. 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   6. The inferior vena cava is normal in size with greater than 50%  respiratory variability, suggesting right atrial pressure of 3 mmHg.     Neuro/Psych  Neuromuscular disease (right hand tremor)    GI/Hepatic negative GI ROS, Neg liver ROS,,,  Endo/Other  negative endocrine ROS    Renal/GU CRFRenal  disease     Musculoskeletal scoliosis   Abdominal  (+) - obese  Peds  Hematology negative hematology ROS (+)   Anesthesia Other Findings Rosacea, osteoporosis  Reproductive/Obstetrics                             Anesthesia Physical Anesthesia Plan  ASA: 3  Anesthesia Plan: General   Post-op Pain Management:    Induction:   PONV Risk Score and Plan: 4 or greater and Ondansetron, Propofol infusion, Treatment may vary due to age or medical condition and Dexamethasone  Airway Management Planned: Oral ETT  Additional Equipment:   Intra-op Plan:   Post-operative Plan: Extubation in OR  Informed Consent: I have reviewed the patients History and Physical, chart, labs and discussed the procedure including the risks, benefits and alternatives for the proposed anesthesia with the patient or authorized representative who has indicated his/her understanding and acceptance.     Dental advisory given  Plan Discussed with: CRNA and Anesthesiologist  Anesthesia Plan Comments: (Risks of general anesthesia discussed including, but not limited to, sore throat, hoarse voice, chipped/damaged teeth, injury to vocal cords, nausea and vomiting, allergic reactions, lung infection, heart attack, stroke, and death. All questions answered. )        Anesthesia Quick Evaluation

## 2022-10-25 DIAGNOSIS — K432 Incisional hernia without obstruction or gangrene: Secondary | ICD-10-CM | POA: Diagnosis not present

## 2022-10-25 LAB — CBC
HCT: 34 % — ABNORMAL LOW (ref 36.0–46.0)
Hemoglobin: 11.1 g/dL — ABNORMAL LOW (ref 12.0–15.0)
MCH: 31.7 pg (ref 26.0–34.0)
MCHC: 32.6 g/dL (ref 30.0–36.0)
MCV: 97.1 fL (ref 80.0–100.0)
Platelets: 147 10*3/uL — ABNORMAL LOW (ref 150–400)
RBC: 3.5 MIL/uL — ABNORMAL LOW (ref 3.87–5.11)
RDW: 12.5 % (ref 11.5–15.5)
WBC: 7.2 10*3/uL (ref 4.0–10.5)
nRBC: 0 % (ref 0.0–0.2)

## 2022-10-25 LAB — BASIC METABOLIC PANEL
Anion gap: 7 (ref 5–15)
BUN: 18 mg/dL (ref 8–23)
CO2: 23 mmol/L (ref 22–32)
Calcium: 8.3 mg/dL — ABNORMAL LOW (ref 8.9–10.3)
Chloride: 104 mmol/L (ref 98–111)
Creatinine, Ser: 1.22 mg/dL — ABNORMAL HIGH (ref 0.44–1.00)
GFR, Estimated: 46 mL/min — ABNORMAL LOW (ref >60)
Glucose, Bld: 169 mg/dL — ABNORMAL HIGH (ref 70–99)
Potassium: 4.8 mmol/L (ref 3.5–5.1)
Sodium: 134 mmol/L — ABNORMAL LOW (ref 135–145)

## 2022-10-25 MED ORDER — MORPHINE SULFATE (PF) 2 MG/ML IV SOLN
1.0000 mg | INTRAVENOUS | Status: DC | PRN
  Administered 2022-10-25: 4 mg via INTRAVENOUS
  Filled 2022-10-25: qty 2
  Filled 2022-10-25: qty 1

## 2022-10-25 MED ORDER — OXYCODONE-ACETAMINOPHEN 5-325 MG PO TABS
1.0000 | ORAL_TABLET | ORAL | Status: DC | PRN
  Administered 2022-10-25: 2 via ORAL
  Filled 2022-10-25: qty 2

## 2022-10-25 NOTE — Progress Notes (Signed)
1 Day Post-Op   Subjective/Chief Complaint: Complains of soreness. Otherwise ok   Objective: Vital signs in last 24 hours: Temp:  [97 F (36.1 C)-98.3 F (36.8 C)] 98.1 F (36.7 C) (11/11 0823) Pulse Rate:  [63-74] 69 (11/11 0823) Resp:  [12-18] 18 (11/11 0823) BP: (120-162)/(68-88) 124/68 (11/11 0823) SpO2:  [93 %-100 %] 98 % (11/11 0823) Weight:  [69.9 kg] 69.9 kg (11/10 1033) Last BM Date : 10/24/22  Intake/Output from previous day: 11/10 0701 - 11/11 0700 In: 1584.9 [I.V.:1584.9] Out: 15 [Blood:15] Intake/Output this shift: No intake/output data recorded.  General appearance: alert and cooperative Resp: clear to auscultation bilaterally Cardio: regular rate and rhythm GI: soft, appropriately tender  Lab Results:  Recent Labs    10/25/22 0128  WBC 7.2  HGB 11.1*  HCT 34.0*  PLT 147*   BMET Recent Labs    10/25/22 0128  NA 134*  K 4.8  CL 104  CO2 23  GLUCOSE 169*  BUN 18  CREATININE 1.22*  CALCIUM 8.3*   PT/INR No results for input(s): "LABPROT", "INR" in the last 72 hours. ABG No results for input(s): "PHART", "HCO3" in the last 72 hours.  Invalid input(s): "PCO2", "PO2"  Studies/Results: No results found.  Anti-infectives: Anti-infectives (From admission, onward)    Start     Dose/Rate Route Frequency Ordered Stop   10/24/22 1030  ceFAZolin (ANCEF) IVPB 2g/100 mL premix        2 g 200 mL/hr over 30 Minutes Intravenous On call to O.R. 10/24/22 1016 10/24/22 1156       Assessment/Plan: s/p Procedure(s): LAPAROSCOPIC INCISIONAL HERNIA REPAIR WITH MESH (N/A) INSERTION OF MESH (Left) Advance diet Ambulate Plan for d/c tomorrow if she continues to do well Transition to oral pain meds  LOS: 0 days    Autumn Messing III 10/25/2022

## 2022-10-25 NOTE — Care Management Obs Status (Signed)
[facility] NOTIFICATION   Patient Details  Name: Joanne Greene MRN: [id number] Date of Birth: Jan 29, 1946   Medicare Observation Status Notification Given:  Yes    Bartholomew Crews, RN 10/25/2022, 4:37 PM

## 2022-10-26 DIAGNOSIS — Z8249 Family history of ischemic heart disease and other diseases of the circulatory system: Secondary | ICD-10-CM | POA: Diagnosis not present

## 2022-10-26 DIAGNOSIS — Z800 Family history of malignant neoplasm of digestive organs: Secondary | ICD-10-CM | POA: Diagnosis not present

## 2022-10-26 DIAGNOSIS — Z807 Family history of other malignant neoplasms of lymphoid, hematopoietic and related tissues: Secondary | ICD-10-CM | POA: Diagnosis not present

## 2022-10-26 DIAGNOSIS — Z905 Acquired absence of kidney: Secondary | ICD-10-CM | POA: Diagnosis not present

## 2022-10-26 DIAGNOSIS — Z833 Family history of diabetes mellitus: Secondary | ICD-10-CM | POA: Diagnosis not present

## 2022-10-26 DIAGNOSIS — M810 Age-related osteoporosis without current pathological fracture: Secondary | ICD-10-CM | POA: Diagnosis present

## 2022-10-26 DIAGNOSIS — K432 Incisional hernia without obstruction or gangrene: Secondary | ICD-10-CM | POA: Diagnosis present

## 2022-10-26 LAB — CBC
HCT: 31.1 % — ABNORMAL LOW (ref 36.0–46.0)
Hemoglobin: 10.4 g/dL — ABNORMAL LOW (ref 12.0–15.0)
MCH: 32.4 pg (ref 26.0–34.0)
MCHC: 33.4 g/dL (ref 30.0–36.0)
MCV: 96.9 fL (ref 80.0–100.0)
Platelets: 130 10*3/uL — ABNORMAL LOW (ref 150–400)
RBC: 3.21 MIL/uL — ABNORMAL LOW (ref 3.87–5.11)
RDW: 12.8 % (ref 11.5–15.5)
WBC: 5.7 10*3/uL (ref 4.0–10.5)
nRBC: 0 % (ref 0.0–0.2)

## 2022-10-26 LAB — BASIC METABOLIC PANEL
Anion gap: 6 (ref 5–15)
BUN: 19 mg/dL (ref 8–23)
CO2: 25 mmol/L (ref 22–32)
Calcium: 8.5 mg/dL — ABNORMAL LOW (ref 8.9–10.3)
Chloride: 107 mmol/L (ref 98–111)
Creatinine, Ser: 1.3 mg/dL — ABNORMAL HIGH (ref 0.44–1.00)
GFR, Estimated: 42 mL/min — ABNORMAL LOW (ref >60)
Glucose, Bld: 99 mg/dL (ref 70–99)
Potassium: 4.5 mmol/L (ref 3.5–5.1)
Sodium: 138 mmol/L (ref 135–145)

## 2022-10-26 LAB — TROPONIN I (HIGH SENSITIVITY)
Troponin I (High Sensitivity): 9 ng/L (ref <18)
Troponin I (High Sensitivity): 8 ng/L (ref <18)

## 2022-10-26 MED ORDER — TRAMADOL HCL 50 MG PO TABS
50.0000 mg | ORAL_TABLET | Freq: Four times a day (QID) | ORAL | Status: DC | PRN
  Administered 2022-10-26 – 2022-10-27 (×3): 50 mg via ORAL
  Filled 2022-10-26 (×3): qty 1

## 2022-10-26 NOTE — Progress Notes (Signed)
2 Days Post-Op   Subjective/Chief Complaint: Complained of left sided chest pain last night with ectopy after taking percocet   Objective: Vital signs in last 24 hours: Temp:  [97.8 F (36.6 C)-98.7 F (37.1 C)] 97.8 F (36.6 C) (11/12 0815) Pulse Rate:  [62-67] 62 (11/12 0815) Resp:  [17-18] 17 (11/12 0815) BP: (129-137)/(78-85) 137/81 (11/12 0815) SpO2:  [99 %-100 %] 99 % (11/12 0815) Last BM Date : 10/24/22  Intake/Output from previous day: 11/11 0701 - 11/12 0700 In: 240 [P.O.:240] Out: 800 [Urine:800] Intake/Output this shift: No intake/output data recorded.  General appearance: alert and cooperative Resp: clear to auscultation bilaterally Cardio: regular rate and rhythm GI: soft, mild tenderness  Lab Results:  Recent Labs    10/25/22 0128 10/26/22 0137  WBC 7.2 5.7  HGB 11.1* 10.4*  HCT 34.0* 31.1*  PLT 147* 130*   BMET Recent Labs    10/25/22 0128 10/26/22 0137  NA 134* 138  K 4.8 4.5  CL 104 107  CO2 23 25  GLUCOSE 169* 99  BUN 18 19  CREATININE 1.22* 1.30*  CALCIUM 8.3* 8.5*   PT/INR No results for input(s): "LABPROT", "INR" in the last 72 hours. ABG No results for input(s): "PHART", "HCO3" in the last 72 hours.  Invalid input(s): "PCO2", "PO2"  Studies/Results: No results found.  Anti-infectives: Anti-infectives (From admission, onward)    Start     Dose/Rate Route Frequency Ordered Stop   10/24/22 1030  ceFAZolin (ANCEF) IVPB 2g/100 mL premix        2 g 200 mL/hr over 30 Minutes Intravenous On call to O.R. 10/24/22 1016 10/24/22 1156       Assessment/Plan: s/p Procedure(s): LAPAROSCOPIC INCISIONAL HERNIA REPAIR WITH MESH (N/A) INSERTION OF MESH (Left) Advance diet Will check EKG and troponin for chest pain Will switch oral pain meds ambulate  LOS: 0 days    Autumn Messing III 10/26/2022

## 2022-10-26 NOTE — Progress Notes (Signed)
   10/26/22 0053  Provider Notification  Provider Name/Title Dr Dema Severin  Date Provider Notified 10/26/22  Time Provider Notified [phone]  Method of Notification Page  Notification Reason Requested by patient/family (patient does not want her percocet,due to pain to chest,headache)  Provider response No new orders  Date of Provider Response 10/26/22  Time of Provider Response [phone]

## 2022-10-27 ENCOUNTER — Encounter (HOSPITAL_COMMUNITY): Payer: Self-pay | Admitting: General Surgery

## 2022-10-27 MED ORDER — DOCUSATE SODIUM 100 MG PO CAPS: 100.0000 mg | ORAL_CAPSULE | Freq: Every day | ORAL | 1 refills | Status: DC

## 2022-10-27 MED ORDER — TRAMADOL HCL 50 MG PO TABS: 50.0000 mg | ORAL_TABLET | Freq: Four times a day (QID) | ORAL | 0 refills | Status: DC | PRN

## 2022-10-27 NOTE — TOC Transition Note (Signed)
Transition of Care [facility]) - CM/SW Discharge Note   Patient Details  Name: Joanne Greene MRN: [id number] Date of Birth: 1946/09/07  Transition of Care [facility]) CM/SW Contact:  Tom-Johnson, Renea Ee, RN Phone Number: 10/27/2022, 10:32 AM   Clinical Narrative:     Patient is scheduled for discharge today. No TOC needs or recommendations noted. Denies any needs. Family to transport at discharge. No further TOC needs noted.     Final next level of care: Home/Self Care Barriers to Discharge: Barriers Resolved   Patient Goals and CMS Choice Patient states their goals for this hospitalization and ongoing recovery are:: To return home CMS Medicare.gov Compare Post Acute Care list provided to:: Patient Choice offered to / list presented to : NA  Discharge Placement                Patient to be transferred to facility by: Family      Discharge Plan and Services                DME Arranged: N/A DME Agency: NA       HH Arranged: NA HH Agency: NA        Social Determinants of Health (SDOH) Interventions     Readmission Risk Interventions     No data to display

## 2022-10-27 NOTE — Discharge Summary (Addendum)
Physician Discharge Summary  Patient ID: Joanne Greene MRN: [id number] DOB/AGE: [birth year] 76 y.o.  Admit date: 10/24/2022 Discharge date: 10/27/2022  Admission Diagnoses:  Discharge Diagnoses:  Principal Problem:   S/P laparoscopic hernia repair   Discharged Condition: good  Hospital Course: Patient underwent laparoscopic repair of incisional hernia with mesh. She did well post-op but had some side effects from pain medicine. This was changed to Utram which she tolerated much better. Ready for D/U this AM.  Consults: None  Significant Diagnostic Studies: /  Treatments: surgery  Discharge Exam: Blood pressure (!) 151/85, pulse 67, temperature 99.3 F (37.4 C), temperature source Oral, resp. rate 16, height '5\' 7"'$  (1.702 m), weight 69.9 kg, SpO2 97 %. General appearance: alert and cooperative Resp: CTA Cardio: S1, S2 normal GI: soft, repair intact, incisions OK, small contusions abd wall  Disposition: Discharge disposition: 01-Home or Self Care       Discharge Instructions     Call MD for:  redness, tenderness, or signs of infection (pain, swelling, redness, odor or green/yellow discharge around incision site)   Complete by: As directed    Diet - low sodium heart healthy   Complete by: As directed    Discharge instructions   Complete by: As directed    CCS _______[facility]  UMBILICAL OR INGUINAL HERNIA REPAIR: POST OP INSTRUCTIONS  Always review your discharge instruction sheet given to you by the facility where your surgery was performed. IF YOU HAVE DISABILITY OR FAMILY LEAVE FORMS, YOU MUST BRING THEM TO THE OFFICE FOR PROCESSING.   DO NOT GIVE THEM TO YOUR DOCTOR.  1. A  prescription for pain medication may be given to you upon discharge.  Take your pain medication as prescribed, if needed.  If narcotic pain medicine is not needed, then you may take acetaminophen (Tylenol) or ibuprofen (Advil) as needed. 2. Take your usually prescribed  medications unless otherwise directed. If you need a refill on your pain medication, please contact your pharmacy.  They will contact our office to request authorization. Prescriptions will not be filled after 5 pm or on week-ends. 3. You should follow a light diet the first 24 hours after arrival home, such as soup and crackers, etc.  Be sure to include lots of fluids daily.  Resume your normal diet the day after surgery. 4.Most patients will experience some swelling and bruising around the umbilicus or in the groin and scrotum.  Ice packs and reclining will help.  Swelling and bruising can take several days to resolve.  6. It is common to experience some constipation if taking pain medication after surgery.  Increasing fluid intake and taking a stool softener (such as Colace) will usually help or prevent this problem from occurring.  A mild laxative (Milk of Magnesia or Miralax) should be taken according to package directions if there are no bowel movements after 48 hours. 7. Unless discharge instructions indicate otherwise, you may remove your bandages 24-48 hours after surgery, and you may shower at that time.  You may have steri-strips (small skin tapes) in place directly over the incision.  These strips should be left on the skin for 7-10 days.  If your surgeon used skin glue on the incision, you may shower in 24 hours.  The glue will flake off over the next 2-3 weeks.  Any sutures or staples will be removed at the office during your follow-up visit. 8. ACTIVITIES:  You may resume regular (light) daily activities beginning the next day-such as  daily self-care, walking, climbing stairs-gradually increasing activities as tolerated.  You may have sexual intercourse when it is comfortable.  Refrain from any heavy lifting or straining until approved by your doctor.  a.You may drive when you are no longer taking prescription pain medication, you can comfortably wear a seatbelt, and you can safely maneuver  your car and apply brakes. b.RETURN TO WORK:   _____________________________________________  9.You should see your doctor in the office for a follow-up appointment approximately 2-3 weeks after your surgery.  Make sure that you call for this appointment within a day or two after you arrive home to insure a convenient appointment time. 10.OTHER INSTRUCTIONS: _________________________    _____________________________________  WHEN TO CALL YOUR DOCTOR: Fever over 101.0 Inability to urinate Nausea and/or vomiting Extreme swelling or bruising Continued bleeding from incision. Increased pain, redness, or drainage from the incision  The clinic staff is available to answer your questions during regular business hours.  Please don't hesitate to call and ask to speak to one of the nurses for clinical concerns.  If you have a medical emergency, go to the nearest emergency room or call 911.  A surgeon from [facility] is always on call at the hospital   [address], [place], [place], [place]  [address] ?  [address], [place], [place]   [address] [phone] ? [phone] ? FAX [phone] Web site: [email or web address]   Increase activity slowly   Complete by: As directed    Lifting restrictions   Complete by: As directed    No lifting over 10lbs for 6 weeks      Allergies as of 10/27/2022       Reactions   Caffeine    Chest tightness   Demerol [meperidine Hcl] Nausea Only   Flax Seeds [flaxseed (linseed)] Palpitations   Sunflower Seeds/ heart rate and blood pressure up   Rosemary Oil Palpitations   Heart rate and bp elevated   Sunflower Oil Palpitations, Hypertension   Sweet Potato Palpitations, Hypertension        Medication List     STOP taking these medications    VITAMIN D PO       TAKE these medications    docusate sodium 100 MG capsule Commonly known as: Colace Take 1 capsule (100 mg total) by mouth daily.   traMADol 50  MG tablet Commonly known as: ULTRAM Take 1 tablet (50 mg total) by mouth every 6 (six) hours as needed for severe pain.        Follow-up Information     Georganna Skeans, MD Follow up on 11/19/2022.   Specialty: General Surgery Contact information: [place] [id number] [phone]                 Signed: Zenovia Jarred 10/27/2022, 7:43 AM

## 2022-10-27 NOTE — Progress Notes (Signed)
Joanne Greene to be D/C'd Home per MD order.  Discussed with the patient and all questions fully answered.  IV catheter discontinued intact. Site without signs and symptoms of complications. Dressing and pressure applied.  An After Visit Summary was printed and given to the patient. Patient prescriptions sent to her pharmacy.  D/c education completed with patient/family including follow up instructions, medication list, d/c activities limitations if indicated, with other d/c instructions as indicated by MD - patient able to verbalize understanding, all questions fully answered.   Patient instructed to return to ED, call 911, or call MD for any changes in condition.   Patient escorted via [place], and D/C home via private auto.  Manuella Ghazi 10/27/2022 10:50 AM

## 2022-11-10 DIAGNOSIS — H353132 Nonexudative age-related macular degeneration, bilateral, intermediate dry stage: Secondary | ICD-10-CM | POA: Diagnosis not present

## 2022-11-19 DIAGNOSIS — Z8719 Personal history of other diseases of the digestive system: Secondary | ICD-10-CM | POA: Diagnosis not present

## 2022-11-19 DIAGNOSIS — Z9889 Other specified postprocedural states: Secondary | ICD-10-CM | POA: Diagnosis not present

## 2022-11-19 DIAGNOSIS — K432 Incisional hernia without obstruction or gangrene: Secondary | ICD-10-CM | POA: Diagnosis not present

## 2023-02-04 DIAGNOSIS — M9903 Segmental and somatic dysfunction of lumbar region: Secondary | ICD-10-CM | POA: Diagnosis not present

## 2023-02-04 DIAGNOSIS — M5137 Other intervertebral disc degeneration, lumbosacral region: Secondary | ICD-10-CM | POA: Diagnosis not present

## 2023-02-04 DIAGNOSIS — M546 Pain in thoracic spine: Secondary | ICD-10-CM | POA: Diagnosis not present

## 2023-02-04 DIAGNOSIS — M9902 Segmental and somatic dysfunction of thoracic region: Secondary | ICD-10-CM | POA: Diagnosis not present

## 2023-02-04 DIAGNOSIS — M5441 Lumbago with sciatica, right side: Secondary | ICD-10-CM | POA: Diagnosis not present

## 2023-02-04 DIAGNOSIS — M5136 Other intervertebral disc degeneration, lumbar region: Secondary | ICD-10-CM | POA: Diagnosis not present

## 2023-02-04 DIAGNOSIS — M9904 Segmental and somatic dysfunction of sacral region: Secondary | ICD-10-CM | POA: Diagnosis not present

## 2023-03-02 DIAGNOSIS — Q6211 Congenital occlusion of ureteropelvic junction: Secondary | ICD-10-CM | POA: Diagnosis not present

## 2023-03-04 DIAGNOSIS — M5137 Other intervertebral disc degeneration, lumbosacral region: Secondary | ICD-10-CM | POA: Diagnosis not present

## 2023-03-04 DIAGNOSIS — M5136 Other intervertebral disc degeneration, lumbar region: Secondary | ICD-10-CM | POA: Diagnosis not present

## 2023-03-04 DIAGNOSIS — M9903 Segmental and somatic dysfunction of lumbar region: Secondary | ICD-10-CM | POA: Diagnosis not present

## 2023-03-04 DIAGNOSIS — M9904 Segmental and somatic dysfunction of sacral region: Secondary | ICD-10-CM | POA: Diagnosis not present

## 2023-03-04 DIAGNOSIS — M5441 Lumbago with sciatica, right side: Secondary | ICD-10-CM | POA: Diagnosis not present

## 2023-03-04 DIAGNOSIS — M546 Pain in thoracic spine: Secondary | ICD-10-CM | POA: Diagnosis not present

## 2023-03-04 DIAGNOSIS — M9902 Segmental and somatic dysfunction of thoracic region: Secondary | ICD-10-CM | POA: Diagnosis not present

## 2023-03-11 ENCOUNTER — Encounter: Payer: Self-pay | Admitting: Internal Medicine

## 2023-03-11 ENCOUNTER — Ambulatory Visit: Payer: Medicare Other | Admitting: Internal Medicine

## 2023-03-11 VITALS — BP 130/80 | HR 84 | Temp 97.8°F | Resp 18 | Ht 67.0 in | Wt 156.5 lb

## 2023-03-11 DIAGNOSIS — M25561 Pain in right knee: Secondary | ICD-10-CM | POA: Diagnosis not present

## 2023-03-11 DIAGNOSIS — N1831 Chronic kidney disease, stage 3a: Secondary | ICD-10-CM

## 2023-03-11 DIAGNOSIS — R634 Abnormal weight loss: Secondary | ICD-10-CM

## 2023-03-11 DIAGNOSIS — M1711 Unilateral primary osteoarthritis, right knee: Secondary | ICD-10-CM | POA: Diagnosis not present

## 2023-03-11 NOTE — Assessment & Plan Note (Signed)
I reviewed her labs from urology where there is no change in her CKD.  They did a repeat US late last year and her hydronephrosis on the right was not apparent.  Urology will continue to follow.
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
## 2023-03-11 NOTE — Assessment & Plan Note (Signed)
I have assured her that she is only about 2 or so lbs from her last visit.  She has tried ensure/boost which makes her belly upset.  I gave her a sample of glucerna and asked her to take this once a day.

## 2023-03-11 NOTE — Assessment & Plan Note (Signed)
She has some tenderness at her lateral side of her knee joint.  Her knee exam is otherwise normal.  I talked to her about going back to see ortho with this leg length discrepancy but she wishes to do a knee xray first.

## 2023-03-18 DIAGNOSIS — M546 Pain in thoracic spine: Secondary | ICD-10-CM | POA: Diagnosis not present

## 2023-03-18 DIAGNOSIS — M5136 Other intervertebral disc degeneration, lumbar region: Secondary | ICD-10-CM | POA: Diagnosis not present

## 2023-03-18 DIAGNOSIS — M9904 Segmental and somatic dysfunction of sacral region: Secondary | ICD-10-CM | POA: Diagnosis not present

## 2023-03-18 DIAGNOSIS — M5441 Lumbago with sciatica, right side: Secondary | ICD-10-CM | POA: Diagnosis not present

## 2023-03-18 DIAGNOSIS — R079 Chest pain, unspecified: Secondary | ICD-10-CM | POA: Diagnosis not present

## 2023-03-18 DIAGNOSIS — Z5321 Procedure and treatment not carried out due to patient leaving prior to being seen by health care provider: Secondary | ICD-10-CM | POA: Diagnosis not present

## 2023-03-18 DIAGNOSIS — M5137 Other intervertebral disc degeneration, lumbosacral region: Secondary | ICD-10-CM | POA: Diagnosis not present

## 2023-03-18 DIAGNOSIS — M9902 Segmental and somatic dysfunction of thoracic region: Secondary | ICD-10-CM | POA: Diagnosis not present

## 2023-03-18 DIAGNOSIS — R9431 Abnormal electrocardiogram [ECG] [EKG]: Secondary | ICD-10-CM | POA: Diagnosis not present

## 2023-03-18 DIAGNOSIS — I444 Left anterior fascicular block: Secondary | ICD-10-CM | POA: Diagnosis not present

## 2023-03-18 DIAGNOSIS — R109 Unspecified abdominal pain: Secondary | ICD-10-CM | POA: Diagnosis not present

## 2023-03-18 DIAGNOSIS — M9903 Segmental and somatic dysfunction of lumbar region: Secondary | ICD-10-CM | POA: Diagnosis not present

## 2023-03-18 DIAGNOSIS — N1330 Unspecified hydronephrosis: Secondary | ICD-10-CM | POA: Diagnosis not present

## 2023-03-18 DIAGNOSIS — I700 Atherosclerosis of aorta: Secondary | ICD-10-CM | POA: Diagnosis not present

## 2023-03-20 ENCOUNTER — Ambulatory Visit: Payer: Medicare Other | Admitting: Internal Medicine

## 2023-03-20 ENCOUNTER — Encounter: Payer: Self-pay | Admitting: Internal Medicine

## 2023-03-20 VITALS — BP 158/80 | HR 60 | Temp 97.7°F | Resp 16 | Ht 67.0 in | Wt 157.6 lb

## 2023-03-20 DIAGNOSIS — R1084 Generalized abdominal pain: Secondary | ICD-10-CM | POA: Insufficient documentation

## 2023-03-20 NOTE — Assessment & Plan Note (Signed)
I reviewed her CT scan from [facility].  She hasn't taken any cipro for over 24 hours.  Her abdominal pain with vomiting is resolved where she is eating and drinking without problems and having regular BM's.  She does not want to take the cipro and I agree.  She is to [facility] urgent care or the ER over the weekend if she worsens.
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
## 2023-03-23 ENCOUNTER — Ambulatory Visit: Payer: Medicare Other | Admitting: Internal Medicine

## 2023-04-15 DIAGNOSIS — M5137 Other intervertebral disc degeneration, lumbosacral region: Secondary | ICD-10-CM | POA: Diagnosis not present

## 2023-04-15 DIAGNOSIS — M9903 Segmental and somatic dysfunction of lumbar region: Secondary | ICD-10-CM | POA: Diagnosis not present

## 2023-04-15 DIAGNOSIS — M9904 Segmental and somatic dysfunction of sacral region: Secondary | ICD-10-CM | POA: Diagnosis not present

## 2023-04-15 DIAGNOSIS — M9902 Segmental and somatic dysfunction of thoracic region: Secondary | ICD-10-CM | POA: Diagnosis not present

## 2023-04-15 DIAGNOSIS — M5136 Other intervertebral disc degeneration, lumbar region: Secondary | ICD-10-CM | POA: Diagnosis not present

## 2023-04-15 DIAGNOSIS — M5441 Lumbago with sciatica, right side: Secondary | ICD-10-CM | POA: Diagnosis not present

## 2023-04-15 DIAGNOSIS — M546 Pain in thoracic spine: Secondary | ICD-10-CM | POA: Diagnosis not present

## 2023-04-20 ENCOUNTER — Ambulatory Visit (INDEPENDENT_AMBULATORY_CARE_PROVIDER_SITE_OTHER): Payer: Medicare Other | Admitting: Podiatry

## 2023-04-20 DIAGNOSIS — L600 Ingrowing nail: Secondary | ICD-10-CM
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
## 2023-04-20 NOTE — Patient Instructions (Signed)

## 2023-04-24 DIAGNOSIS — L578 Other skin changes due to chronic exposure to nonionizing radiation: Secondary | ICD-10-CM | POA: Diagnosis not present

## 2023-04-24 DIAGNOSIS — L814 Other melanin hyperpigmentation: Secondary | ICD-10-CM | POA: Diagnosis not present

## 2023-04-24 DIAGNOSIS — C44319 Basal cell carcinoma of skin of other parts of face: Secondary | ICD-10-CM | POA: Diagnosis not present

## 2023-05-04 ENCOUNTER — Ambulatory Visit (INDEPENDENT_AMBULATORY_CARE_PROVIDER_SITE_OTHER): Payer: Medicare Other | Admitting: Podiatry

## 2023-05-04 DIAGNOSIS — L600 Ingrowing nail: Secondary | ICD-10-CM

## 2023-05-04 NOTE — Progress Notes (Signed)
Subjective: Joanne Greene is a 77 y.o.  female returns to office today for follow up evaluation after having left Hallux medial border nail ingrown removal with phenol and alcohol matrixectomy approximately 2 weeks ago. Patient has been soaking using epsom salts and applying topical antibiotic covered with bandaid daily. Patient denies fevers, chills, nausea, vomiting. Denies any calf pain, chest pain, SOB.   Objective:  Vitals: Reviewed  General: Well developed, nourished, in no acute distress, alert and oriented x3   Dermatology: Skin is warm, dry and supple bilateral. left hallux nail border appears to be clean, dry, with mild granular tissue and surrounding scab. There is no surrounding erythema, edema, drainage/purulence. The remaining nails appear unremarkable at this time. There are no other lesions or other signs of infection present.  Neurovascular status: Intact. No lower extremity swelling; No pain with calf compression bilateral.  Musculoskeletal: Decreased tenderness to palpation of the left hallux nail fold(s). Muscular strength within normal limits bilateral.   Assesement and Plan: S/p phenol and alcohol matrixectomy to the  left hallux nail medial, doing well.   -Continue soaking in epsom salts twice a day followed by antibiotic ointment and a band-aid. Can leave uncovered at night. Continue this until completely healed.  -If the area has not healed in 2 weeks, call the office for follow-up appointment, or sooner if any problems arise.  -Monitor for any signs/symptoms of infection. Call the office immediately if any occur or go directly to the emergency room. Call with any questions/concerns.        Corinna Gab, DPM [facility] / [facility]                   05/04/2023

## 2023-05-12 DIAGNOSIS — M5137 Other intervertebral disc degeneration, lumbosacral region: Secondary | ICD-10-CM | POA: Diagnosis not present

## 2023-05-12 DIAGNOSIS — M5441 Lumbago with sciatica, right side: Secondary | ICD-10-CM | POA: Diagnosis not present

## 2023-05-12 DIAGNOSIS — M9902 Segmental and somatic dysfunction of thoracic region: Secondary | ICD-10-CM | POA: Diagnosis not present

## 2023-05-12 DIAGNOSIS — M9904 Segmental and somatic dysfunction of sacral region: Secondary | ICD-10-CM | POA: Diagnosis not present

## 2023-05-12 DIAGNOSIS — M9903 Segmental and somatic dysfunction of lumbar region: Secondary | ICD-10-CM | POA: Diagnosis not present

## 2023-05-12 DIAGNOSIS — M546 Pain in thoracic spine: Secondary | ICD-10-CM | POA: Diagnosis not present

## 2023-05-12 DIAGNOSIS — M5136 Other intervertebral disc degeneration, lumbar region: Secondary | ICD-10-CM | POA: Diagnosis not present

## 2023-05-13 DIAGNOSIS — C44319 Basal cell carcinoma of skin of other parts of face: Secondary | ICD-10-CM | POA: Diagnosis not present

## 2023-05-27 ENCOUNTER — Ambulatory Visit: Payer: Medicare Other | Admitting: Student

## 2023-05-27 DIAGNOSIS — L578 Other skin changes due to chronic exposure to nonionizing radiation: Secondary | ICD-10-CM | POA: Diagnosis not present

## 2023-05-27 DIAGNOSIS — C44319 Basal cell carcinoma of skin of other parts of face: Secondary | ICD-10-CM | POA: Diagnosis not present

## 2023-05-27 DIAGNOSIS — L821 Other seborrheic keratosis: Secondary | ICD-10-CM | POA: Diagnosis not present

## 2023-05-27 DIAGNOSIS — L570 Actinic keratosis: Secondary | ICD-10-CM | POA: Diagnosis not present

## 2023-05-28 ENCOUNTER — Encounter: Payer: Self-pay | Admitting: Student

## 2023-05-28 ENCOUNTER — Ambulatory Visit: Payer: 59 | Admitting: Student

## 2023-05-28 VITALS — BP 122/66 | HR 80 | Temp 98.1°F | Resp 18 | Ht 67.0 in | Wt 155.2 lb

## 2023-05-28 DIAGNOSIS — M109 Gout, unspecified: Secondary | ICD-10-CM | POA: Insufficient documentation

## 2023-05-28 DIAGNOSIS — M79672 Pain in left foot: Secondary | ICD-10-CM | POA: Diagnosis not present

## 2023-05-28 MED ORDER — COLCHICINE 0.6 MG PO TABS: 0.6000 mg | ORAL_TABLET | Freq: Every day | ORAL | 0 refills | Status: AC

## 2023-05-28 MED ORDER — DOXYCYCLINE HYCLATE 100 MG PO CAPS: 100.0000 mg | ORAL_CAPSULE | Freq: Two times a day (BID) | ORAL | 0 refills | Status: AC

## 2023-05-28 MED ORDER — METHYLPREDNISOLONE 4 MG PO TBPK: ORAL_TABLET | Freq: Four times a day (QID) | ORAL | 0 refills | Status: AC

## 2023-05-28 NOTE — Progress Notes (Signed)
Acute Office Visit  Subjective:     Patient ID: Joanne Greene, female    DOB: 06/16/1946, 77 y.o.   MRN: [id number]  Chief Complaint  Patient presents with   Toe Pain    Left great toe;started about 4 days ago Swelling, red and hot to touch    HPI Patient is in today for pain, warmth and redness on the side of the left great toe for four days.  She denies any injuries and reports the pain is 4/10 when walking. She uses orthotics in her shoes daily, there is no skin breakdown or open areas.  She has not used any OTC meds for swelling Her desire is to resume activity as normal and to avoid taking too many medications for concern of heart irregularities. No history of gout.   Review of Systems  Constitutional: Negative.  Negative for chills and fever.  Respiratory: Negative.    Cardiovascular: Negative.   Gastrointestinal: Negative.   Musculoskeletal:  Positive for joint pain.  Skin: Negative.   Neurological: Negative.   Endo/Heme/Allergies: Negative.   Psychiatric/Behavioral: Negative.          Objective:    BP 122/66 (BP Location: Left Arm, Patient Position: Sitting, Cuff Size: Normal)   Pulse 80   Temp 98.1 F (36.7 C) (Temporal)   Resp 18   Ht 5\' 7"  (1.702 m)   Wt 155 lb 3.2 oz (70.4 kg)   SpO2 99%   BMI 24.31 kg/m    Physical Exam Vitals reviewed.  Constitutional:      Appearance: Normal appearance.  Cardiovascular:     Rate and Rhythm: Normal rate and regular rhythm.     Pulses: Normal pulses.     Heart sounds: Normal heart sounds.  Musculoskeletal:       Feet:  Feet:     Right foot:     Skin integrity: Skin integrity normal.     Left foot:     Skin integrity: Erythema and warmth present.     Toenail Condition: Left toenails are normal.     Comments: Edema +2 Skin:    General: Skin is warm and dry.  Neurological:     Mental Status: She is alert and oriented to person, place, and time.  Psychiatric:        Mood and Affect: Mood normal.      No results found for any visits on 05/28/23.      Assessment & Plan:   Problem List Items Addressed This Visit     Acute gout involving toe of left foot - Primary    Colchicine 0.6 mg for 30 days, Medrol dose pack and Doxycycline BID x 10 days for treatment of gout attack.   The only thing you need to avoid is grapefruit and grapefruit juice. This is because grapefruit can increase the amount of colchicine in your blood.  Avoid or limit high purine foods and drinks like organ meats, shellfish, beer, and products with high-fructose corn syrup. Eating less animal protein like meat keeps the urine less acid, which may lower the risk for gout flares and kidney stones. Eat lots of vegetables and fruits.  Drink plenty of water, unless your healthcare professional says you need to restrict your fluid intake. Drink water at night too, when crystals tend to form more often.      Relevant Medications   colchicine 0.6 MG tablet   doxycycline (VIBRAMYCIN) 100 MG capsule   methylPREDNISolone (MEDROL DOSEPAK) 4 MG TBPK tablet  Other Relevant Orders   DG Foot Complete Left    Meds ordered this encounter  Medications   colchicine 0.6 MG tablet    Sig: Take 1 tablet (0.6 mg total) by mouth daily.    Dispense:  30 tablet    Refill:  0   doxycycline (VIBRAMYCIN) 100 MG capsule    Sig: Take 1 capsule (100 mg total) by mouth 2 (two) times daily.    Dispense:  20 capsule    Refill:  0   methylPREDNISolone (MEDROL DOSEPAK) 4 MG TBPK tablet    Sig: Take by mouth taper from 4 doses each day to 1 dose and stop.    Dispense:  21 tablet    Refill:  0    Return in about 1 week (around 06/04/2023) for evaluate gout in left foot .  Edwena Blow, NP

## 2023-05-28 NOTE — Assessment & Plan Note (Addendum)
An order for X-ray has been placed  Use Colchicine 0.6 mg for 30 days, Medrol dose pack and Doxycycline BID x 10 days for treatment of gout attack  Follow up in 1 week.   The only thing you need to avoid is grapefruit and grapefruit juice. This is because grapefruit can increase the amount of colchicine in your blood.  Avoid or limit high purine foods and drinks like organ meats, shellfish, beer, and products with high-fructose corn syrup. Eating less animal protein like meat keeps the urine less acid, which may lower the risk for gout flares and kidney stones. Eat lots of vegetables and fruits.  Drink plenty of water, unless your healthcare professional says you need to restrict your fluid intake. Drink water at night too, when crystals tend to form more often.

## 2023-06-04 ENCOUNTER — Ambulatory Visit: Payer: Medicare Other | Admitting: Student

## 2023-06-04 ENCOUNTER — Encounter: Payer: Self-pay | Admitting: Student

## 2023-06-04 VITALS — BP 128/84 | HR 70 | Temp 98.0°F | Resp 18 | Ht 67.0 in | Wt 154.2 lb

## 2023-06-04 DIAGNOSIS — M109 Gout, unspecified: Secondary | ICD-10-CM
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
## 2023-06-04 NOTE — Assessment & Plan Note (Signed)
She is finishing up the course of medications as we speak.  Since there is +2 pitting edema in the left lower leg, I have instructed her to begin using compression socks as well as elevation when able to.   We have discussed dietary options to assist with her renal function as well as to prevent gout attacks.

## 2023-06-10 DIAGNOSIS — M5136 Other intervertebral disc degeneration, lumbar region: Secondary | ICD-10-CM | POA: Diagnosis not present

## 2023-06-10 DIAGNOSIS — M9902 Segmental and somatic dysfunction of thoracic region: Secondary | ICD-10-CM | POA: Diagnosis not present

## 2023-06-10 DIAGNOSIS — M5137 Other intervertebral disc degeneration, lumbosacral region: Secondary | ICD-10-CM | POA: Diagnosis not present

## 2023-06-10 DIAGNOSIS — M5441 Lumbago with sciatica, right side: Secondary | ICD-10-CM | POA: Diagnosis not present

## 2023-06-10 DIAGNOSIS — M9904 Segmental and somatic dysfunction of sacral region: Secondary | ICD-10-CM | POA: Diagnosis not present

## 2023-06-10 DIAGNOSIS — M546 Pain in thoracic spine: Secondary | ICD-10-CM | POA: Diagnosis not present

## 2023-06-10 DIAGNOSIS — M9903 Segmental and somatic dysfunction of lumbar region: Secondary | ICD-10-CM | POA: Diagnosis not present

## 2023-06-26 DIAGNOSIS — M9904 Segmental and somatic dysfunction of sacral region: Secondary | ICD-10-CM | POA: Diagnosis not present

## 2023-06-26 DIAGNOSIS — M9903 Segmental and somatic dysfunction of lumbar region: Secondary | ICD-10-CM | POA: Diagnosis not present

## 2023-06-26 DIAGNOSIS — M9902 Segmental and somatic dysfunction of thoracic region: Secondary | ICD-10-CM | POA: Diagnosis not present

## 2023-06-26 DIAGNOSIS — M546 Pain in thoracic spine: Secondary | ICD-10-CM | POA: Diagnosis not present

## 2023-06-26 DIAGNOSIS — M5441 Lumbago with sciatica, right side: Secondary | ICD-10-CM | POA: Diagnosis not present

## 2023-06-26 DIAGNOSIS — M5136 Other intervertebral disc degeneration, lumbar region: Secondary | ICD-10-CM | POA: Diagnosis not present

## 2023-06-26 DIAGNOSIS — M5137 Other intervertebral disc degeneration, lumbosacral region: Secondary | ICD-10-CM | POA: Diagnosis not present

## 2023-07-29 ENCOUNTER — Other Ambulatory Visit (HOSPITAL_COMMUNITY): Payer: Self-pay | Admitting: Urology

## 2023-07-29 ENCOUNTER — Other Ambulatory Visit: Payer: Self-pay | Admitting: Urology

## 2023-07-29 DIAGNOSIS — Q6211 Congenital occlusion of ureteropelvic junction: Secondary | ICD-10-CM

## 2023-08-10 ENCOUNTER — Ambulatory Visit (HOSPITAL_COMMUNITY)
Admission: RE | Admit: 2023-08-10 | Discharge: 2023-08-10 | Disposition: A | Discharge status: Home / Self Care | Payer: Medicare Other | Source: Ambulatory Visit | Attending: Urology | Admitting: Urology

## 2023-08-10 DIAGNOSIS — Q6211 Congenital occlusion of ureteropelvic junction: Secondary | ICD-10-CM | POA: Diagnosis not present

## 2023-08-10 DIAGNOSIS — N1330 Unspecified hydronephrosis: Secondary | ICD-10-CM | POA: Diagnosis not present

## 2023-08-12 DIAGNOSIS — M5441 Lumbago with sciatica, right side: Secondary | ICD-10-CM | POA: Diagnosis not present

## 2023-08-12 DIAGNOSIS — M5137 Other intervertebral disc degeneration, lumbosacral region: Secondary | ICD-10-CM | POA: Diagnosis not present

## 2023-08-12 DIAGNOSIS — M9902 Segmental and somatic dysfunction of thoracic region: Secondary | ICD-10-CM | POA: Diagnosis not present

## 2023-08-12 DIAGNOSIS — M5136 Other intervertebral disc degeneration, lumbar region: Secondary | ICD-10-CM | POA: Diagnosis not present

## 2023-08-12 DIAGNOSIS — M9903 Segmental and somatic dysfunction of lumbar region: Secondary | ICD-10-CM | POA: Diagnosis not present

## 2023-08-12 DIAGNOSIS — M9904 Segmental and somatic dysfunction of sacral region: Secondary | ICD-10-CM | POA: Diagnosis not present

## 2023-08-12 DIAGNOSIS — M546 Pain in thoracic spine: Secondary | ICD-10-CM | POA: Diagnosis not present

## 2023-08-20 DIAGNOSIS — Q6211 Congenital occlusion of ureteropelvic junction: Secondary | ICD-10-CM | POA: Diagnosis not present

## 2023-09-01 ENCOUNTER — Encounter: Payer: Medicare Other | Admitting: Internal Medicine

## 2023-09-09 DIAGNOSIS — M9902 Segmental and somatic dysfunction of thoracic region: Secondary | ICD-10-CM | POA: Diagnosis not present

## 2023-09-09 DIAGNOSIS — M546 Pain in thoracic spine: Secondary | ICD-10-CM | POA: Diagnosis not present

## 2023-09-09 DIAGNOSIS — M9904 Segmental and somatic dysfunction of sacral region: Secondary | ICD-10-CM | POA: Diagnosis not present

## 2023-09-09 DIAGNOSIS — M5136 Other intervertebral disc degeneration, lumbar region: Secondary | ICD-10-CM | POA: Diagnosis not present

## 2023-09-09 DIAGNOSIS — M5137 Other intervertebral disc degeneration, lumbosacral region: Secondary | ICD-10-CM | POA: Diagnosis not present

## 2023-09-09 DIAGNOSIS — M5441 Lumbago with sciatica, right side: Secondary | ICD-10-CM | POA: Diagnosis not present

## 2023-09-09 DIAGNOSIS — M9903 Segmental and somatic dysfunction of lumbar region: Secondary | ICD-10-CM | POA: Diagnosis not present

## 2023-09-14 DIAGNOSIS — H353132 Nonexudative age-related macular degeneration, bilateral, intermediate dry stage: Secondary | ICD-10-CM | POA: Diagnosis not present

## 2023-09-14 DIAGNOSIS — H2513 Age-related nuclear cataract, bilateral: Secondary | ICD-10-CM | POA: Diagnosis not present

## 2023-09-22 ENCOUNTER — Encounter: Payer: Self-pay | Admitting: Internal Medicine
